# Patient Record
Sex: Female | Born: 1985 | Race: White | Hispanic: No | Marital: Married | Smoking: Never smoker
Health system: Southern US, Community
[De-identification: ages and names within clinical notes are randomized; demographics above are authoritative.]

## PROBLEM LIST (undated history)

## (undated) DIAGNOSIS — R7303 Prediabetes: Secondary | ICD-10-CM

## (undated) DIAGNOSIS — Z8619 Personal history of other infectious and parasitic diseases: Secondary | ICD-10-CM

## (undated) DIAGNOSIS — E282 Polycystic ovarian syndrome: Secondary | ICD-10-CM

## (undated) DIAGNOSIS — I10 Essential (primary) hypertension: Secondary | ICD-10-CM

## (undated) DIAGNOSIS — R519 Headache, unspecified: Secondary | ICD-10-CM

## (undated) DIAGNOSIS — E669 Obesity, unspecified: Secondary | ICD-10-CM

## (undated) DIAGNOSIS — F32A Depression, unspecified: Secondary | ICD-10-CM

## (undated) DIAGNOSIS — M2550 Pain in unspecified joint: Secondary | ICD-10-CM

## (undated) DIAGNOSIS — Z87440 Personal history of urinary (tract) infections: Secondary | ICD-10-CM

## (undated) DIAGNOSIS — G43909 Migraine, unspecified, not intractable, without status migrainosus: Secondary | ICD-10-CM

## (undated) HISTORY — PX: WISDOM TOOTH EXTRACTION: SHX21

## (undated) HISTORY — PX: LAPAROSCOPIC REPAIR AND REMOVAL OF GASTRIC BAND: SHX5919

## (undated) HISTORY — DX: Depression, unspecified: F32.A

## (undated) HISTORY — DX: Prediabetes: R73.03

## (undated) HISTORY — DX: Headache, unspecified: R51.9

## (undated) HISTORY — DX: Personal history of urinary (tract) infections: Z87.440

## (undated) HISTORY — DX: Polycystic ovarian syndrome: E28.2

## (undated) HISTORY — DX: Migraine, unspecified, not intractable, without status migrainosus: G43.909

## (undated) HISTORY — PX: KNEE SURGERY: SHX244

## (undated) HISTORY — DX: Pain in unspecified joint: M25.50

## (undated) HISTORY — DX: Personal history of other infectious and parasitic diseases: Z86.19

## (undated) HISTORY — PX: APPENDECTOMY: SHX54

---

## 2015-12-06 ENCOUNTER — Encounter (HOSPITAL_BASED_OUTPATIENT_CLINIC_OR_DEPARTMENT_OTHER): Payer: Self-pay | Admitting: *Deleted

## 2015-12-06 ENCOUNTER — Emergency Department (HOSPITAL_BASED_OUTPATIENT_CLINIC_OR_DEPARTMENT_OTHER)
Admission: EM | Admit: 2015-12-06 | Discharge: 2015-12-06 | Disposition: A | Discharge status: Home / Self Care | Payer: Medicaid Other | Attending: Emergency Medicine | Admitting: Emergency Medicine

## 2015-12-06 DIAGNOSIS — Z79899 Other long term (current) drug therapy: Secondary | ICD-10-CM | POA: Insufficient documentation

## 2015-12-06 DIAGNOSIS — I159 Secondary hypertension, unspecified: Secondary | ICD-10-CM | POA: Diagnosis not present

## 2015-12-06 DIAGNOSIS — I10 Essential (primary) hypertension: Secondary | ICD-10-CM | POA: Diagnosis present

## 2015-12-06 HISTORY — DX: Essential (primary) hypertension: I10

## 2015-12-06 HISTORY — DX: Obesity, unspecified: E66.9

## 2015-12-06 LAB — URINALYSIS, ROUTINE W REFLEX MICROSCOPIC
BILIRUBIN URINE: NEGATIVE
Glucose, UA: NEGATIVE mg/dL
KETONES UR: NEGATIVE mg/dL
Leukocytes, UA: NEGATIVE
NITRITE: NEGATIVE
Protein, ur: NEGATIVE mg/dL
SPECIFIC GRAVITY, URINE: 1.021 (ref 1.005–1.030)
pH: 7 (ref 5.0–8.0)

## 2015-12-06 LAB — BASIC METABOLIC PANEL
Anion gap: 10 (ref 5–15)
BUN: 12 mg/dL (ref 6–20)
CO2: 26 mmol/L (ref 22–32)
Calcium: 8.5 mg/dL — ABNORMAL LOW (ref 8.9–10.3)
Chloride: 100 mmol/L — ABNORMAL LOW (ref 101–111)
Creatinine, Ser: 0.76 mg/dL (ref 0.44–1.00)
GFR calc Af Amer: >60 mL/min (ref >60)
GFR calc non Af Amer: >60 mL/min (ref >60)
Glucose, Bld: 93 mg/dL (ref 65–99)
Potassium: 3.2 mmol/L — ABNORMAL LOW (ref 3.5–5.1)
Sodium: 136 mmol/L (ref 135–145)

## 2015-12-06 LAB — PREGNANCY, URINE: PREG TEST UR: NEGATIVE

## 2015-12-06 LAB — URINE MICROSCOPIC-ADD ON

## 2015-12-06 MED ORDER — IBUPROFEN 400 MG PO TABS
600.0000 mg | ORAL_TABLET | Freq: Once | ORAL | Status: AC
  Administered 2015-12-06: 600 mg via ORAL
  Filled 2015-12-06: qty 1

## 2015-12-06 MED ORDER — LISINOPRIL 10 MG PO TABS
10.0000 mg | ORAL_TABLET | Freq: Once | ORAL | Status: AC
  Administered 2015-12-06: 10 mg via ORAL
  Filled 2015-12-06: qty 1

## 2015-12-06 MED ORDER — LISINOPRIL 10 MG PO TABS: 10.0000 mg | ORAL_TABLET | Freq: Once | ORAL | Status: DC

## 2015-12-06 MED FILL — LISINOPRIL 10 MG TABLET: 10 | 30 days supply | Qty: 30 | Fill #0

## 2015-12-06 NOTE — ED Provider Notes (Signed)
CSN: [id number]     Arrival date & time 12/06/15  1017 History   First MD Initiated Contact with Patient 12/06/15 1034     Chief Complaint  Patient presents with  . Hypertension     (Consider location/radiation/quality/duration/timing/severity/associated sxs/prior Treatment) HPI Comments: Patient is a 30 year old female with history of hypertension who presents with elevated blood pressure over the past 12 hours. Patient states she has had intermittent lightheadedness, headaches, and left ear tinnitus, which she states she normally gets when her blood pressure is elevated. Patient states she had a few shooting pains in her head and a pulsating headache last night that resolved when she woke up. Patient then had a left-sided headache and she woke up that resolved with Advil. Patient denies any vision changes, however she states that she feels like her vision is not as crisp as normal. Patient took her blood pressure many times throughout the night. Patient states that her blood pressure was as high as 155/110 and as low as 139/107. Patient seemed to be very upset by her elevated blood pressure. Patient has taken her amlodipine and hydrochlorothiazide, however patient has not been able to take her prescribed losartan due to an insurance issue. Patient is currently in transition between primary care providers and is waiting for her medical records to be transferred. She believes she'll be able to establish care soon. Patient denies any chest pain, shortness of breath, abdominal pain, nausea, vomiting, dysuria.  Patient is a 30 y.o. female presenting with hypertension. The history is provided by the patient.  Hypertension Associated symptoms include headaches. Pertinent negatives include no abdominal pain, chest pain, chills, fever, nausea, rash, sore throat or vomiting.    Past Medical History  Diagnosis Date  . Hypertension   . Obesity    Past Surgical History  Procedure Laterality Date  .  Appendectomy    . Knee surgery    . Cesarean section    . Wisdom tooth extraction     No family history on file. Social History  Substance Use Topics  . Smoking status: Never Smoker   . Smokeless tobacco: None  . Alcohol Use: No   OB History    No data available     Review of Systems  Constitutional: Negative for fever and chills.  HENT: Negative for facial swelling and sore throat.   Eyes: Positive for visual disturbance.  Respiratory: Negative for shortness of breath.   Cardiovascular: Negative for chest pain.  Gastrointestinal: Negative for nausea, vomiting and abdominal pain.  Genitourinary: Negative for dysuria.  Musculoskeletal: Negative for back pain.  Skin: Negative for rash and wound.  Neurological: Positive for light-headedness and headaches.  Psychiatric/Behavioral: The patient is not nervous/anxious.       Allergies  Penicillins  Home Medications   Prior to Admission medications   Medication Sig Start Date End Date Taking? Authorizing Provider  amLODipine (NORVASC) 10 MG tablet Take 10 mg by mouth daily.   Yes Historical Provider, MD  hydrochlorothiazide (HYDRODIURIL) 25 MG tablet Take 25 mg by mouth daily.   Yes Historical Provider, MD   BP 117/81 mmHg  Pulse 85  Temp(Src) 98.1 F (36.7 C) (Oral)  Resp 16  Wt 148.009 kg  SpO2 100%  LMP 11/09/2015 (Approximate) Physical Exam  Constitutional: She appears well-developed and well-nourished. No distress.  HENT:  Head: Normocephalic and atraumatic.  Mouth/Throat: Oropharynx is clear and moist. No oropharyngeal exudate.  Eyes: Conjunctivae and EOM are normal. Pupils are equal, round, and reactive to  light. Right eye exhibits no discharge. Left eye exhibits no discharge. No scleral icterus.  Visual acuity 20/15 bilaterally  Neck: Normal range of motion. Neck supple. No thyromegaly present.  Cardiovascular: Normal rate, regular rhythm, normal heart sounds and intact distal pulses.  Exam reveals no gallop  and no friction rub.   No murmur heard. Pulmonary/Chest: Effort normal and breath sounds normal. No stridor. No respiratory distress. She has no wheezes. She has no rales.  Abdominal: Soft. Bowel sounds are normal. She exhibits no distension. There is no tenderness. There is no rebound and no guarding.  Musculoskeletal: She exhibits no edema.  Lymphadenopathy:    She has no cervical adenopathy.  Neurological: She is alert. Coordination normal.  CN 3-12 intact; normal sensation throughout; 5/5 strength in all 4 extremities; equal bilateral grip strength; no ataxia on finger to nose   Skin: Skin is warm and dry. No rash noted. She is not diaphoretic. No pallor.  Psychiatric: She has a normal mood and affect.  Nursing note and vitals reviewed.   ED Course  Procedures (including critical care time) Labs Review Labs Reviewed  URINALYSIS, ROUTINE W REFLEX MICROSCOPIC (NOT AT [facility]) - Abnormal; Notable for the following:    APPearance CLOUDY (*)    Hgb urine dipstick SMALL (*)    All other components within normal limits  URINE MICROSCOPIC-ADD ON - Abnormal; Notable for the following:    Squamous Epithelial / LPF 6-30 (*)    Bacteria, UA MANY (*)    All other components within normal limits  BASIC METABOLIC PANEL - Abnormal; Notable for the following:    Potassium 3.2 (*)    Chloride 100 (*)    Calcium 8.5 (*)    All other components within normal limits  PREGNANCY, URINE    Imaging Review No results found. I have personally reviewed and evaluated these images and lab results as part of my medical decision-making.   EKG Interpretation None      MDM   Patient did have some episodes of left ear tinnitus and mild headache while in ED. Normal neuro exam, no focal deficits, visual acuity 20/15. BMP shows potassium 3.2, chloride 100, calcium 8.5. Urine pregnancy negative. Urine culture sent and would treat if culture returns positive; patient currently denying any urinary symptoms. I  suspect dirty specimen. I gave patient lisinopril 10 mg in ED to reduce BP 117/81. Patient's symptoms completely resolved following blood pressure reduction, oral fluids, and a snack. I will discharge patient home with lisinopril 10 mg in addition to her at home medications. I discussed strict return precautions regarding hypertensive urgency and emergency. I also discussed signs of low blood pressure and how to respond. I advised patient to follow up and establish care with her PCP as soon as possible for further evaluation and treatment of her blood pressure. Patient understands and is in agreement with plan. Patient discharged in satisfactory condition.  Final diagnoses:  Secondary hypertension, unspecified       Emi Holes, PA-C 12/06/15 1722  Lavera Guise, MD 12/06/15 [phone]

## 2015-12-06 NOTE — Discharge Instructions (Signed)
Medications: Lisinopril  Treatment: Take the lisinopril as prescribed for your blood pressure. Taking your other blood pressure medications as prescribed. Drink plenty of water, at least 8 glasses of water per day.  Follow-up: Please follow-up and establish care with your primary care provider as soon as possible for further evaluation and treatment of your high blood pressure. Please return to emergency department if you develop any new or worsening symptoms such as chest pain, shortness of breath, changes in urination, change in vision, or any other concerning symptoms.   Hypertension Hypertension, commonly called high blood pressure, is when the force of blood pumping through your arteries is too strong. Your arteries are the blood vessels that carry blood from your heart throughout your body. A blood pressure reading consists of a higher number over a lower number, such as 110/72. The higher number (systolic) is the pressure inside your arteries when your heart pumps. The lower number (diastolic) is the pressure inside your arteries when your heart relaxes. Ideally you want your blood pressure below 120/80. Hypertension forces your heart to work harder to pump blood. Your arteries may become narrow or stiff. Having untreated or uncontrolled hypertension can cause heart attack, stroke, kidney disease, and other problems. RISK FACTORS Some risk factors for high blood pressure are controllable. Others are not.  Risk factors you cannot control include:   Race. You may be at higher risk if you are African American.  Age. Risk increases with age.  Gender. Men are at higher risk than women before age 31 years. After age 31, women are at higher risk than men. Risk factors you can control include:  Not getting enough exercise or physical activity.  Being overweight.  Getting too much fat, sugar, calories, or salt in your diet.  Drinking too much alcohol. SIGNS AND SYMPTOMS Hypertension does  not usually cause signs or symptoms. Extremely high blood pressure (hypertensive crisis) may cause headache, anxiety, shortness of breath, and nosebleed. DIAGNOSIS To check if you have hypertension, your health care provider will measure your blood pressure while you are seated, with your arm held at the level of your heart. It should be measured at least twice using the same arm. Certain conditions can cause a difference in blood pressure between your right and left arms. A blood pressure reading that is higher than normal on one occasion does not mean that you need treatment. If it is not clear whether you have high blood pressure, you may be asked to return on a different day to have your blood pressure checked again. Or, you may be asked to monitor your blood pressure at home for 1 or more weeks. TREATMENT Treating high blood pressure includes making lifestyle changes and possibly taking medicine. Living a healthy lifestyle can help lower high blood pressure. You may need to change some of your habits. Lifestyle changes may include:  Following the DASH diet. This diet is high in fruits, vegetables, and whole grains. It is low in salt, red meat, and added sugars.  Keep your sodium intake below 2,300 mg per day.  Getting at least 30-45 minutes of aerobic exercise at least 4 times per week.  Losing weight if necessary.  Not smoking.  Limiting alcoholic beverages.  Learning ways to reduce stress. Your health care provider may prescribe medicine if lifestyle changes are not enough to get your blood pressure under control, and if one of the following is true:  You are 5318-31 years of age and your systolic blood pressure  is above 140.  You are 75 years of age or older, and your systolic blood pressure is above 150.  Your diastolic blood pressure is above 90.  You have diabetes, and your systolic blood pressure is over 140 or your diastolic blood pressure is over 90.  You have kidney disease  and your blood pressure is above 140/90.  You have heart disease and your blood pressure is above 140/90. Your personal target blood pressure may vary depending on your medical conditions, your age, and other factors. HOME CARE INSTRUCTIONS  Have your blood pressure rechecked as directed by your health care provider.   Take medicines only as directed by your health care provider. Follow the directions carefully. Blood pressure medicines must be taken as prescribed. The medicine does not work as well when you skip doses. Skipping doses also puts you at risk for problems.  Do not smoke.   Monitor your blood pressure at home as directed by your health care provider. SEEK MEDICAL CARE IF:   You think you are having a reaction to medicines taken.  You have recurrent headaches or feel dizzy.  You have swelling in your ankles.  You have trouble with your vision. SEEK IMMEDIATE MEDICAL CARE IF:  You develop a severe headache or confusion.  You have unusual weakness, numbness, or feel faint.  You have severe chest or abdominal pain.  You vomit repeatedly.  You have trouble breathing. MAKE SURE YOU:   Understand these instructions.  Will watch your condition.  Will get help right away if you are not doing well or get worse.   This information is not intended to replace advice given to you by your health care provider. Make sure you discuss any questions you have with your health care provider.   Document Released: 06/04/2005 Document Revised: 10/19/2014 Document Reviewed: 03/27/2013 Elsevier Interactive Patient Education [employer]

## 2015-12-06 NOTE — ED Notes (Signed)
Pt here due to HA and ringing in ears at home which she attributes to hypertension not controlled with BP meds.  Pt is on HZTC 25mg  and amlodipine 10mg  at home daily.  Pt is supposed to be on losartan but due to insurance issue she has been without this for a few months.  BP 159/114 at beginning of triage, pt tearful. No pain at this time.

## 2015-12-07 LAB — URINE CULTURE
Culture: 1000 — AB
Special Requests: NORMAL

## 2020-03-20 ENCOUNTER — Emergency Department: Payer: Medicaid Other

## 2020-03-20 ENCOUNTER — Emergency Department
Admission: EM | Admit: 2020-03-20 | Discharge: 2020-03-20 | Disposition: A | Discharge status: Home / Self Care | Payer: Medicaid Other | Attending: Emergency Medicine | Admitting: Emergency Medicine

## 2020-03-20 ENCOUNTER — Encounter: Payer: Self-pay | Admitting: Radiology

## 2020-03-20 ENCOUNTER — Other Ambulatory Visit: Payer: Self-pay

## 2020-03-20 DIAGNOSIS — R55 Syncope and collapse: Secondary | ICD-10-CM | POA: Diagnosis not present

## 2020-03-20 DIAGNOSIS — Z79899 Other long term (current) drug therapy: Secondary | ICD-10-CM | POA: Diagnosis not present

## 2020-03-20 DIAGNOSIS — I10 Essential (primary) hypertension: Secondary | ICD-10-CM | POA: Insufficient documentation

## 2020-03-20 DIAGNOSIS — W1809XA Striking against other object with subsequent fall, initial encounter: Secondary | ICD-10-CM | POA: Insufficient documentation

## 2020-03-20 DIAGNOSIS — M25572 Pain in left ankle and joints of left foot: Secondary | ICD-10-CM | POA: Diagnosis present

## 2020-03-20 DIAGNOSIS — M25562 Pain in left knee: Secondary | ICD-10-CM | POA: Insufficient documentation

## 2020-03-20 LAB — BASIC METABOLIC PANEL
Anion gap: 10 (ref 5–15)
BUN: 16 mg/dL (ref 6–20)
CO2: 23 mmol/L (ref 22–32)
Calcium: 8.6 mg/dL — ABNORMAL LOW (ref 8.9–10.3)
Chloride: 104 mmol/L (ref 98–111)
Creatinine, Ser: 0.81 mg/dL (ref 0.44–1.00)
GFR calc Af Amer: >60 mL/min (ref >60)
GFR calc non Af Amer: >60 mL/min (ref >60)
Glucose, Bld: 147 mg/dL — ABNORMAL HIGH (ref 70–99)
Potassium: 3.2 mmol/L — ABNORMAL LOW (ref 3.5–5.1)
Sodium: 137 mmol/L (ref 135–145)

## 2020-03-20 LAB — GLUCOSE, CAPILLARY: Glucose-Capillary: 147 mg/dL — ABNORMAL HIGH (ref 70–99)

## 2020-03-20 LAB — CBC
HCT: 35.9 % — ABNORMAL LOW (ref 36.0–46.0)
Hemoglobin: 11.8 g/dL — ABNORMAL LOW (ref 12.0–15.0)
MCH: 23.7 pg — ABNORMAL LOW (ref 26.0–34.0)
MCHC: 32.9 g/dL (ref 30.0–36.0)
MCV: 72.1 fL — ABNORMAL LOW (ref 80.0–100.0)
Platelets: 331 10*3/uL (ref 150–400)
RBC: 4.98 MIL/uL (ref 3.87–5.11)
RDW: 16.1 % — ABNORMAL HIGH (ref 11.5–15.5)
WBC: 8.3 10*3/uL (ref 4.0–10.5)
nRBC: 0 % (ref 0.0–0.2)

## 2020-03-20 MED ORDER — ACETAMINOPHEN 500 MG PO TABS
1000.0000 mg | ORAL_TABLET | Freq: Once | ORAL | Status: AC
  Administered 2020-03-20: 1000 mg via ORAL

## 2020-03-20 MED ORDER — ACETAMINOPHEN 325 MG PO TABS: 650.0000 mg | ORAL_TABLET | Freq: Once | ORAL | Status: AC

## 2020-03-20 MED ORDER — ACETAMINOPHEN 325 MG PO TABS
ORAL_TABLET | ORAL | Status: AC
  Administered 2020-03-20: 650 mg via ORAL
  Filled 2020-03-20: qty 2

## 2020-03-20 NOTE — ED Provider Notes (Signed)
[facility] Emergency Department Provider Note   ____________________________________________   First MD Initiated Contact with Patient 03/20/20 2219     (approximate)  I have reviewed the triage vital signs and the nursing notes.   HISTORY  Chief Complaint Ankle Pain and Loss of Consciousness   HPI Tabitha Golden is a 34 y.o. female patient reports she tripped and twisted or hit her left ankle and heard a pop in her left ankle.  Here in the emergency room when she was moving she had pain and then got very lightheaded passed out briefly and was sleepy for a long time after that.  She denies taking any medications.  She fell asleep multiple times in triage but was arousable.  We got a head CT just to make sure eating was okay and it was.  Patient complains of pain in her ankle and knee with movement.         Past Medical History:  Diagnosis Date  . Hypertension   . Obesity     There are no problems to display for this patient.   Past Surgical History:  Procedure Laterality Date  . APPENDECTOMY    . CESAREAN SECTION    . KNEE SURGERY    . WISDOM TOOTH EXTRACTION      Prior to Admission medications   Medication Sig Start Date End Date Taking? Authorizing Provider  amLODipine (NORVASC) 10 MG tablet Take 10 mg by mouth daily.    [provider]  hydrochlorothiazide (HYDRODIURIL) 25 MG tablet Take 25 mg by mouth daily.    [provider]  lisinopril (PRINIVIL,ZESTRIL) 10 MG tablet Take 1 tablet (10 mg total) by mouth once. 12/06/15   Emi Holes, PA-C    Allergies Penicillins  No family history on file.  Social History Social History   Tobacco Use  . Smoking status: Never Smoker  Substance Use Topics  . Alcohol use: No  . Drug use: Not on file    Review of Systems  Constitutional: No fever/chills Eyes: No visual changes. ENT: No sore throat. Cardiovascular: Denies chest pain. Respiratory: Denies shortness of  breath. Gastrointestinal: No abdominal pain.  No nausea, no vomiting.  No diarrhea.  No constipation. Genitourinary: Negative for dysuria. Musculoskeletal: Negative for back pain. Skin: Negative for rash. Neurological: Negative for headaches, focal weakness   ____________________________________________   PHYSICAL EXAM:  VITAL SIGNS: ED Triage Vitals  Enc Vitals Group     BP 03/20/20 1621 140/81     Pulse Rate 03/20/20 1621 88     Resp 03/20/20 1621 18     Temp 03/20/20 1627 99.5 F (37.5 C)     Temp Source 03/20/20 1627 Oral     SpO2 03/20/20 1621 99 %     Weight 03/20/20 1619 (!) 350 lb (158.8 kg)     Height 03/20/20 1619 5\' 7"  (1.702 m)     Head Circumference --      Peak Flow --      Pain Score 03/20/20 1618 7     Pain Loc --      Pain Edu? --      Excl. in GC? --    Constitutional: Patient is now alert and oriented. Well appearing and in no acute distress. Eyes: Conjunctivae are normal. PER. EOMI. Head: Atraumatic. Nose: No congestion/rhinnorhea. Mouth/Throat: Mucous membranes are moist.  Oropharynx non-erythematous. Neck: No stridor Cardiovascular: Normal rate, regular rhythm. Grossly normal heart sounds.  Good peripheral circulation. Respiratory: Normal respiratory  effort.  No retractions. Lungs CTAB. Gastrointestinal: Soft and nontender. No distention. No abdominal bruits. No CVA tenderness. Musculoskeletal: No lower extremity tenderness nor edema.  Ankle is tender laterally over the tibia to palpation.  Somewhat difficult to check stability because of pain but it appears to be stable knee on the left side is tender posteriorly there is no noticeable effusion there is no anterior lateral and medial tenderness only posterior tenderness when I attempt to check for stability it there is some pain posteriorly the knee does appear to be stable. Neurologic:  Normal speech and language. No gross focal neurologic deficits are appreciated.  Skin:  Skin is warm, dry and  intact.    ____________________________________________   LABS (all labs ordered are listed, but only abnormal results are displayed)  Labs Reviewed  BASIC METABOLIC PANEL - Abnormal; Notable for the following components:      Result Value   Potassium 3.2 (*)    Glucose, Bld 147 (*)    Calcium 8.6 (*)    All other components within normal limits  CBC - Abnormal; Notable for the following components:   Hemoglobin 11.8 (*)    HCT 35.9 (*)    MCV 72.1 (*)    MCH 23.7 (*)    RDW 16.1 (*)    All other components within normal limits  GLUCOSE, CAPILLARY - Abnormal; Notable for the following components:   Glucose-Capillary 147 (*)    All other components within normal limits  URINALYSIS, COMPLETE (UACMP) WITH MICROSCOPIC  CBG MONITORING, ED  POC URINE PREG, ED   ____________________________________________  EKG Read interpreted by me shows normal sinus rhythm rate of 83 normal axis there are Q's inferiorly but no other changes.  There are no old EKGs  ____________________________________________  RADIOLOGY Jill Poling, personally viewed and evaluated these images (plain radiographs) as part of my medical decision making, as well as reviewing the written report by the radiologist.  ED MD interpretation ankle x-ray read by radiology reviewed by me shows only the sclerotic area in the left tibia. CT head read by radiology also reviewed by me does not show any problems. Official radiology report(s): DG Ankle Complete Left  Result Date: 03/20/2020 CLINICAL DATA:  Status post trauma. EXAM: LEFT ANKLE COMPLETE - 3+ VIEW COMPARISON:  None. FINDINGS: There is no evidence of an acute fracture, dislocation, or joint effusion. A 3.3 cm x 1.8 cm x 1.5 cm well-defined sclerotic area, containing innumerable subcentimeter lytic foci, is seen within the medullary region of the distal left tibia. There is mild diffuse soft tissue swelling. IMPRESSION: 1. No acute fracture or dislocation.  2. Benign-appearing sclerotic within the distal left tibia. Further evaluation with nonemergent MRI recommended. Electronically Signed   By: Aram Candela M.D.   On: 03/20/2020 18:28   CT HEAD WO CONTRAST  Result Date: 03/20/2020 CLINICAL DATA:  Fall EXAM: CT HEAD WITHOUT CONTRAST TECHNIQUE: Contiguous axial images were obtained from the base of the skull through the vertex without intravenous contrast. COMPARISON:  MR brain dated 08/14/2019 FINDINGS: Brain: No evidence of acute infarction, hemorrhage, hydrocephalus, extra-axial collection or mass lesion/mass effect. Vascular: No hyperdense vessel or unexpected calcification. Skull: Normal. Negative for fracture or focal lesion. Sinuses/Orbits: The visualized paranasal sinuses are essentially clear. The mastoid air cells are unopacified. Other: None. IMPRESSION: Normal head CT. Electronically Signed   By: Charline Bills M.D.   On: 03/20/2020 16:48    ____________________________________________   PROCEDURES  Procedure(s) performed (including Critical  Care):  Procedures   ____________________________________________   INITIAL IMPRESSION / ASSESSMENT AND PLAN / ED COURSE  Patient is now awake alert and oriented.  She looks well.  CT was negative.  EKG looks okay.  Is possible and even likely that she had a vasovagal episode from pain while she was moving.  We will have her follow-up with her primary care for this and with orthopedics for her knee and ankle pain.              ____________________________________________   FINAL CLINICAL IMPRESSION(S) / ED DIAGNOSES  Final diagnoses:  Acute left ankle pain  Acute pain of left knee  Syncope, unspecified syncope type     ED Discharge Orders    None      *Please note:  Chong January was evaluated in Emergency Department on 03/20/2020 for the symptoms described in the history of present illness. She was evaluated in the context of the global COVID-19 pandemic, which  necessitated consideration that the patient might be at risk for infection with the SARS-CoV-2 virus that causes COVID-19. Institutional protocols and algorithms that pertain to the evaluation of patients at risk for COVID-19 are in a state of rapid change based on information released by regulatory bodies including the CDC and federal and state organizations. These policies and algorithms were followed during the patient's care in the ED.  Some ED evaluations and interventions may be delayed as a result of limited staffing during and the pandemic.*   Note:  This document was prepared using Dragon voice recognition software and may include unintentional dictation errors.    Arnaldo Natal, MD 03/21/20 Aretha Parrot

## 2020-03-20 NOTE — ED Notes (Signed)
Fell onto her rearend from a curb. Has complaints of left ankle pain. Has history of HTN. BP elevated slightly.

## 2020-03-20 NOTE — ED Notes (Signed)
Pt to xray

## 2020-03-20 NOTE — ED Triage Notes (Addendum)
Pt comes POV after tripping and hitting left ankle. Pt also states that afterwards she felt lightheaded and sleepy. Can't stay awake since ankle popped. Pt denies taking any medications. Pt falling asleep multiple times in triage but arousable to speech. PERRLA.

## 2020-03-20 NOTE — Discharge Instructions (Addendum)
Please use Advil or Tylenol for pain.  You can put ice on your knee and ankle for 20 minutes every hour or 2.  Wear the ankle stirrup splint when you are walking.  Use the walker to limit the weightbearing on your knee and ankle.  Please call Dr. Rondel Baton office in the morning and set up a follow-up appointment in about a week.  Please follow-up with your primary care doctor for the episode of passing out here in the emergency room.

## 2021-08-11 IMAGING — CT CT HEAD W/O CM
3 series · 15 of 47 positions shown, 18 images · non-contrast
Comparison: MR brain dated 08/14/2019

CLINICAL DATA: Fall

EXAM:
CT HEAD WITHOUT CONTRAST
TECHNIQUE: Contiguous axial images were obtained from the base of the skull
through the vertex without intravenous contrast.

[Series 3: coronal soft tissue · coronal · 0.30mm/px · 3 of 77 slices shown]
[im 28/77  brain]
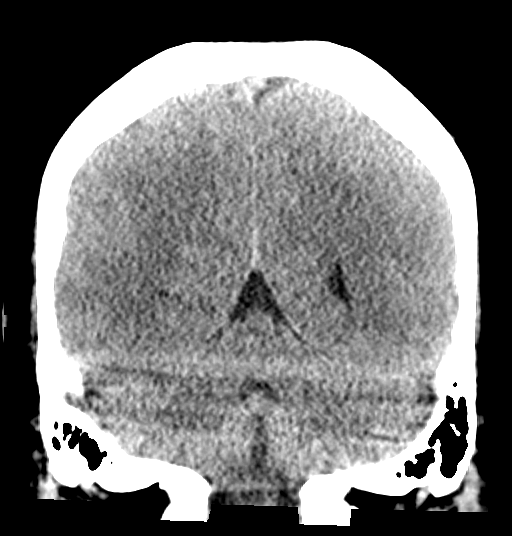
[im 35/77  brain]
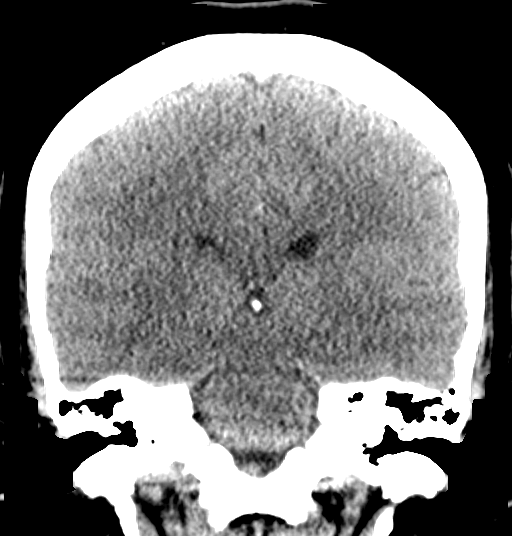
[im 42/77  brain]
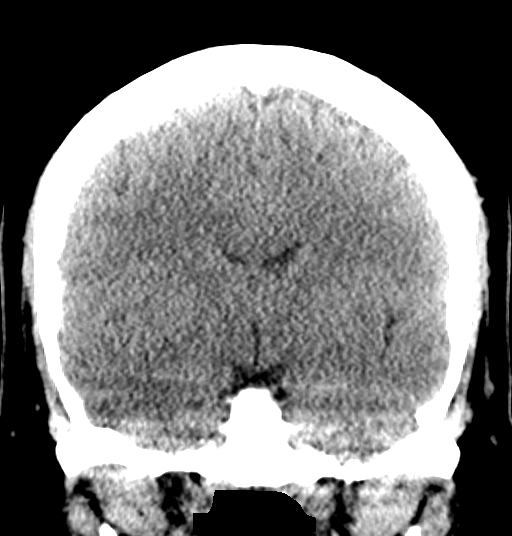

[Series 4: sagittal soft tissue · sagittal · 0.31mm/px · 3 of 51 slices shown]
[im 17/51  brain]
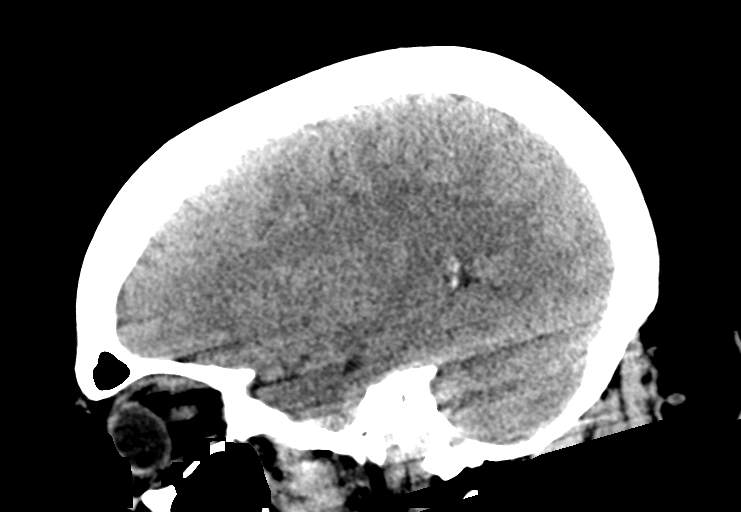
[im 26/51  brain]
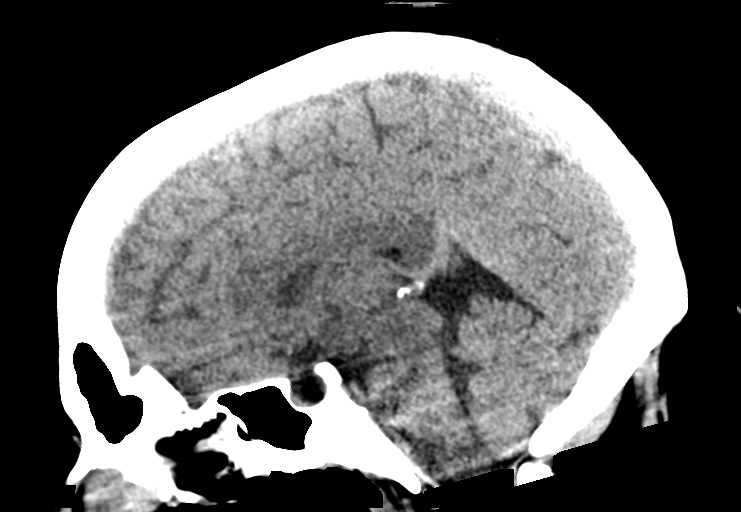
[im 34/51  brain]
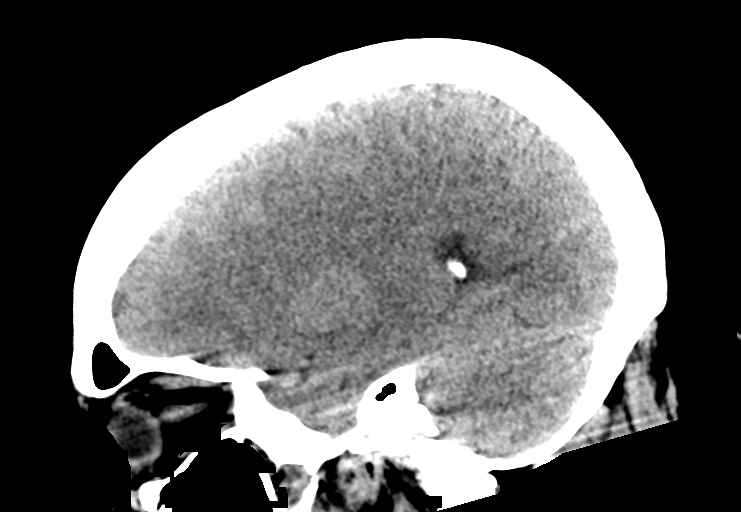

[Series 5: head wo · axial · 0.48mm/px · z∈[-139,-9]mm · 9 of 32 slices shown, 12 images]
[im 3/32  brain]
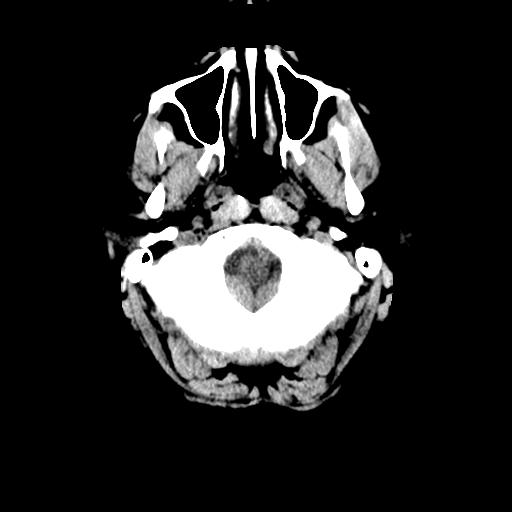
[im 3/32  bone]
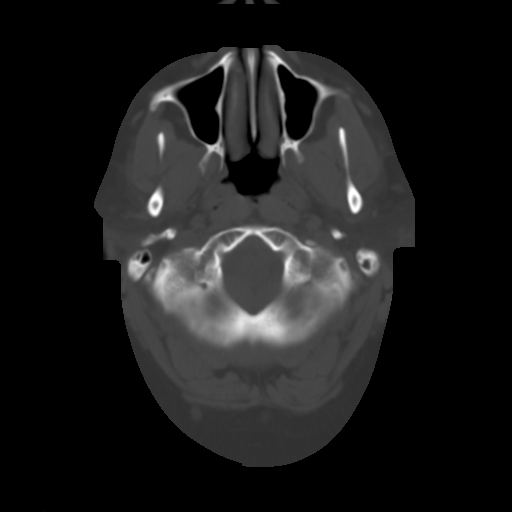
[im 6/32  brain]
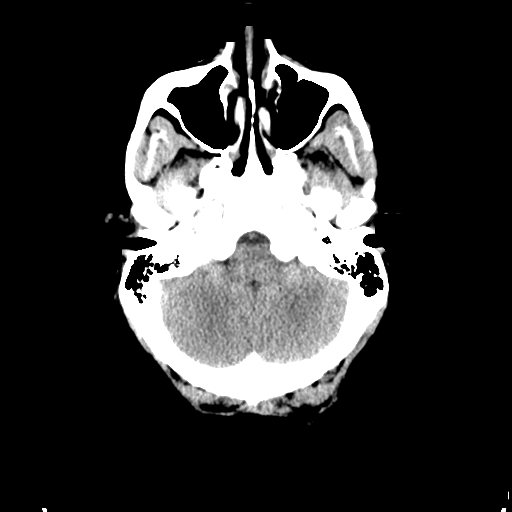
[im 9/32  brain]
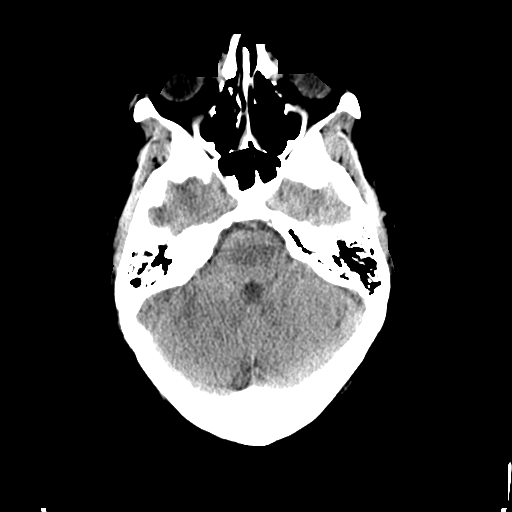
[im 12/32  brain]
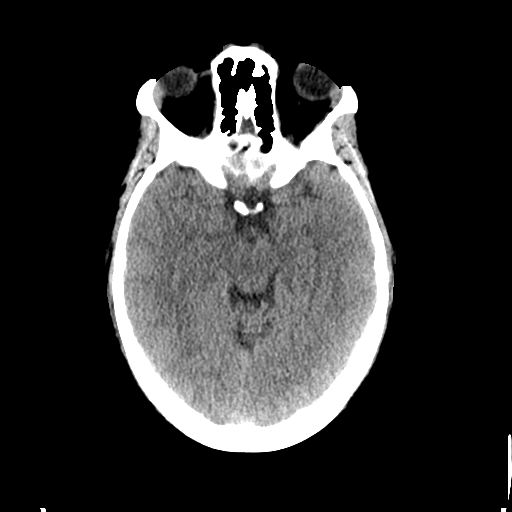
[im 17/32  brain]
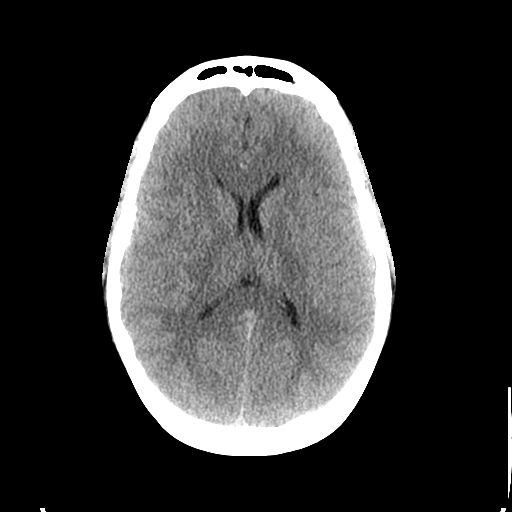
[im 17/32  bone]
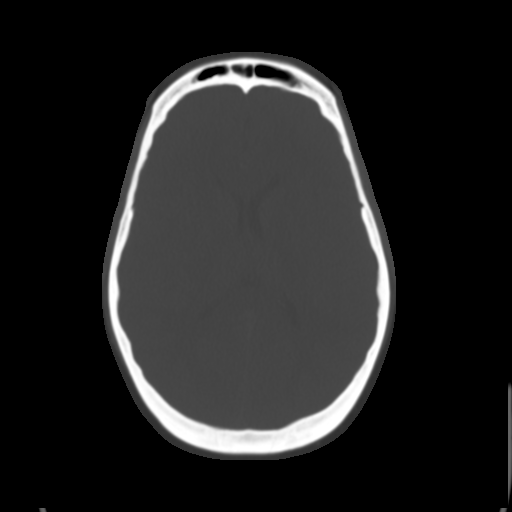
[im 20/32  brain]
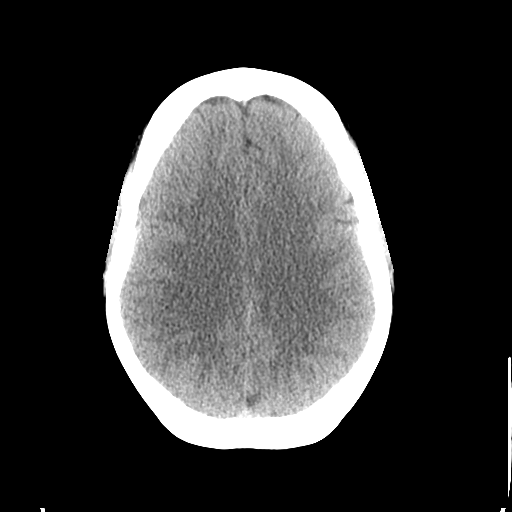
[im 23/32  brain]
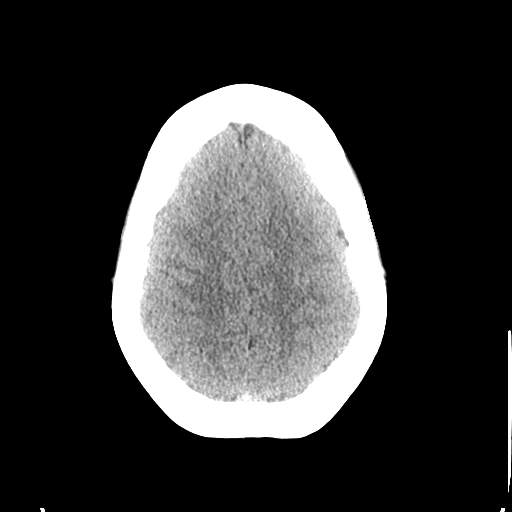
[im 26/32  brain]
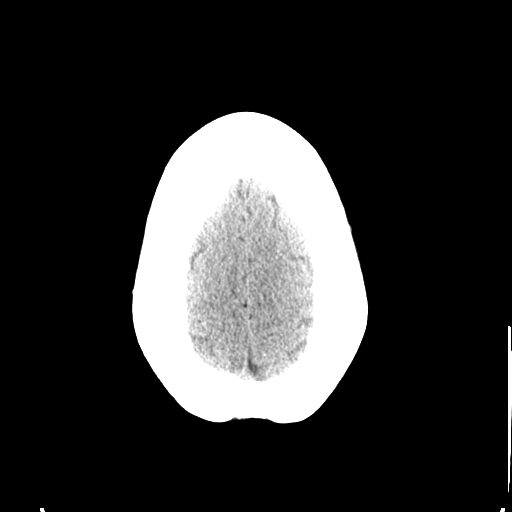
[im 29/32  brain]
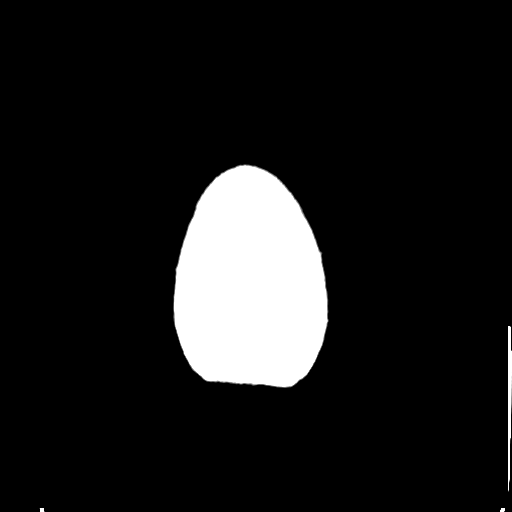
[im 29/32  bone]
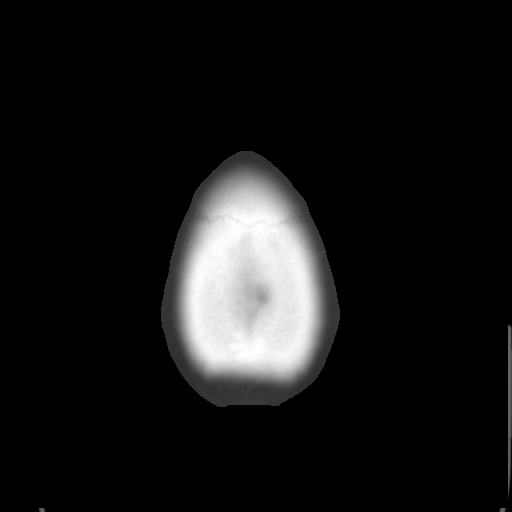

[15 of 47 positions shown; findings below may reference images not displayed]

FINDINGS: Brain: No evidence of acute infarction, hemorrhage, hydrocephalus,
extra-axial collection or mass lesion/mass effect.

Vascular: No hyperdense vessel or unexpected calcification.

Skull: Normal. Negative for fracture or focal lesion.

Sinuses/Orbits: The visualized paranasal sinuses are essentially
clear. The mastoid air cells are unopacified.

Other: None.
IMPRESSION: Normal head CT.

## 2021-09-06 ENCOUNTER — Telehealth: Payer: Self-pay

## 2021-09-06 NOTE — Telephone Encounter (Signed)
[facility] referring for [facility] women[facility] exam. Paper records in media. Scheduled with with CNM or PA within 4-6 wks. Called and left voicemail for patient to call back to be scheduled.

## 2021-09-07 NOTE — Telephone Encounter (Signed)
Called and left voicemail for patient to call back to be scheduled. 

## 2021-09-12 NOTE — Telephone Encounter (Signed)
Patient is scheduled for 09/26/21 with [facility]

## 2021-09-26 ENCOUNTER — Encounter: Payer: Medicaid Other | Admitting: Obstetrics

## 2021-11-01 ENCOUNTER — Encounter: Payer: Medicaid Other | Admitting: Obstetrics

## 2021-11-20 ENCOUNTER — Encounter: Payer: Medicaid Other | Admitting: Obstetrics

## 2021-11-29 ENCOUNTER — Encounter: Payer: Self-pay | Admitting: Obstetrics

## 2021-11-29 ENCOUNTER — Other Ambulatory Visit (HOSPITAL_COMMUNITY)
Admission: RE | Admit: 2021-11-29 | Discharge: 2021-11-29 | Disposition: A | Discharge status: Home / Self Care | Payer: Medicaid Other | Source: Ambulatory Visit | Attending: Obstetrics | Admitting: Obstetrics

## 2021-11-29 ENCOUNTER — Ambulatory Visit (INDEPENDENT_AMBULATORY_CARE_PROVIDER_SITE_OTHER): Payer: 59 | Admitting: Obstetrics

## 2021-11-29 VITALS — BP 122/84 | Ht 67.0 in | Wt 332.0 lb

## 2021-11-29 DIAGNOSIS — Z124 Encounter for screening for malignant neoplasm of cervix: Secondary | ICD-10-CM | POA: Diagnosis not present

## 2021-11-29 DIAGNOSIS — Z01419 Encounter for gynecological examination (general) (routine) without abnormal findings: Secondary | ICD-10-CM | POA: Diagnosis not present

## 2021-11-29 NOTE — Progress Notes (Signed)
Gynecology Annual Exam   PCP: Pcp, No  Chief Complaint:  Chief Complaint  Patient presents with   Annual Exam    History of Present Illness: Patient is a 36 y.o. [id number] presents for annual exam. The patient has no complaints today.   LMP: Patient's last menstrual period was 11/13/2021 (approximate). Average Interval: regular, 22 days Duration of flow: 5 days Heavy Menses: no Clots: no Intermenstrual Bleeding: no Postcoital Bleeding: no Dysmenorrhea: no  The patient is sexually active. She currently uses tubal ligation for contraception. She denies dyspareunia.  The patient does perform self breast exams.  There is no notable family history of breast or ovarian cancer in her family.  The patient wears seatbelts: yes.   The patient has regular exercise: no.    The patient denies current symptoms of depression.    Review of Systems: Review of Systems  Constitutional: Negative.   HENT: Negative.    Eyes: Negative.   Respiratory: Negative.    Cardiovascular: Negative.   Gastrointestinal: Negative.   Genitourinary: Negative.   Musculoskeletal: Negative.   Skin: Negative.   Neurological: Negative.   Endo/Heme/Allergies: Negative.   Psychiatric/Behavioral: Negative.      Past Medical History:  There are no problems to display for this patient.   Past Surgical History:  Past Surgical History:  Procedure Laterality Date   APPENDECTOMY     CESAREAN SECTION     KNEE SURGERY     WISDOM TOOTH EXTRACTION      Gynecologic History:  Patient's last menstrual period was 11/13/2021 (approximate). Contraception: tubal ligation Last Pap: Results were:  results unavailable. She reports it has been three years since last pap    Obstetric History: [id number]  Family History:  History reviewed. No pertinent family history.  Social History:  Social History   Socioeconomic History   Marital status: Married    Spouse name: Not on file   Number of children: 2   Years of  education: Not on file   Highest education level: Not on file  Occupational History   Not on file  Tobacco Use   Smoking status: Never   Smokeless tobacco: Not on file  Substance and Sexual Activity   Alcohol use: No    Comment: occasional   Drug use: Never   Sexual activity: Yes    Birth control/protection: Surgical  Other Topics Concern   Not on file  Social History Narrative   Not on file   Social Determinants of Health   Financial Resource Strain: Not on file  Food Insecurity: Not on file  Transportation Needs: Not on file  Physical Activity: Not on file  Stress: Not on file  Social Connections: Not on file  Intimate Partner Violence: Not on file    Allergies:  Allergies  Allergen Reactions   Penicillins Hives and Rash   Other Other (See Comments)    Unknown blood pressure medication per pt, experienced dry mouth Unknown blood pressure medication per pt, experienced dry mouth. Procardia    Lisinopril     Other reaction(s): Other (See Comments) Cough Cough    Penciclovir Rash    Medications: Prior to Admission medications   Medication Sig Start Date End Date Taking? Authorizing Provider  amLODipine (NORVASC) 10 MG tablet Take 10 mg by mouth daily.   Yes [provider]  lisinopril (PRINIVIL,ZESTRIL) 10 MG tablet Take 1 tablet (10 mg total) by mouth once. 12/06/15  Yes Law, Waylan Boga, PA-C  losartan-hydrochlorothiazide (HYZAAR) 50-12.5  MG tablet Take 1 tablet by mouth daily. 08/30/21  Yes [provider]  montelukast (SINGULAIR) 10 MG tablet as needed.   Yes [provider]  OZEMPIC, 0.25 OR 0.5 MG/DOSE, 2 MG/3ML SOPN SMARTSIG:0.2 Milliliter(s) SUB-Q Once a Week 10/24/21  Yes [provider]    Physical Exam Vitals: Blood pressure 122/84, height 5\' 7"  (1.702 m), weight (!) 332 lb (150.6 kg), last menstrual period 11/13/2021.  General: NAD HEENT: normocephalic, anicteric Thyroid: no enlargement, no palpable  nodules Pulmonary: No increased work of breathing, CTAB Cardiovascular: RRR, distal pulses 2+ Breast: Breast symmetrical, no tenderness, no palpable nodules or masses, no skin or nipple retraction present, no nipple discharge.  No axillary or supraclavicular lymphadenopathy. Abdomen: NABS, soft, non-tender, non-distended.  Umbilicus without lesions.  No hepatomegaly, splenomegaly or masses palpable. No evidence of hernia  Genitourinary:  External: Normal external female genitalia.  Normal urethral meatus, normal Bartholin's and Skene's glands.    Vagina: Normal vaginal mucosa, no evidence of prolapse.    Cervix: Grossly normal in appearance, no bleeding  Uterus: Non-enlarged, mobile, normal contour.  No CMT  Adnexa: ovaries non-enlarged, no adnexal masses  Rectal: deferred  Lymphatic: no evidence of inguinal lymphadenopathy Extremities: no edema, erythema, or tenderness Neurologic: Grossly intact Psychiatric: mood appropriate, affect full  Female chaperone present for pelvic and breast  portions of the physical exam    Assessment: 36 y.o. 30 routine annual exam  Plan: Problem List Items Addressed This Visit   None Visit Diagnoses     Women's annual routine gynecological examination    -  Primary   Cervical cancer screening           2) STI screening  wasoffered and declined  2)  ASCCP guidelines and rational discussed.  Patient opts for every 3 years screening interval  3) Contraception - the patient is currently using  tubal ligation.  She is happy with her current form of contraception and plans to continue  4) Routine healthcare maintenance including cholesterol, diabetes screening discussed managed by PCP  5) No follow-ups on file.  We discussed her need for a more active regime and adlso gently addressed weight loss. She is working on this via [id number]. Has had a lap band in the past. Being followed by PCP for this.   [place], CNM  11/29/2021 9:19 AM    [facility], [facility] 11/29/2021, 9:19 AM

## 2021-12-04 LAB — CYTOLOGY - PAP
Comment: NEGATIVE
Diagnosis: NEGATIVE
High risk HPV: NEGATIVE

## 2022-09-05 ENCOUNTER — Encounter (INDEPENDENT_AMBULATORY_CARE_PROVIDER_SITE_OTHER): Payer: 59 | Admitting: Family Medicine

## 2022-09-10 DIAGNOSIS — Z0289 Encounter for other administrative examinations: Secondary | ICD-10-CM

## 2022-09-12 ENCOUNTER — Encounter (INDEPENDENT_AMBULATORY_CARE_PROVIDER_SITE_OTHER): Payer: 59 | Admitting: Family Medicine

## 2022-10-03 ENCOUNTER — Encounter (INDEPENDENT_AMBULATORY_CARE_PROVIDER_SITE_OTHER): Payer: Self-pay | Admitting: Family Medicine

## 2022-10-03 ENCOUNTER — Ambulatory Visit (INDEPENDENT_AMBULATORY_CARE_PROVIDER_SITE_OTHER): Payer: No Typology Code available for payment source | Admitting: Family Medicine

## 2022-10-03 VITALS — BP 162/92 | HR 81 | Temp 97.5°F | Ht 68.0 in | Wt 366.0 lb

## 2022-10-03 DIAGNOSIS — R0602 Shortness of breath: Secondary | ICD-10-CM

## 2022-10-03 DIAGNOSIS — E785 Hyperlipidemia, unspecified: Secondary | ICD-10-CM

## 2022-10-03 DIAGNOSIS — Z9189 Other specified personal risk factors, not elsewhere classified: Secondary | ICD-10-CM

## 2022-10-03 DIAGNOSIS — R7303 Prediabetes: Secondary | ICD-10-CM

## 2022-10-03 DIAGNOSIS — I10 Essential (primary) hypertension: Secondary | ICD-10-CM | POA: Diagnosis not present

## 2022-10-03 DIAGNOSIS — F339 Major depressive disorder, recurrent, unspecified: Secondary | ICD-10-CM

## 2022-10-03 DIAGNOSIS — R5383 Other fatigue: Secondary | ICD-10-CM

## 2022-10-03 DIAGNOSIS — Z1331 Encounter for screening for depression: Secondary | ICD-10-CM

## 2022-10-03 DIAGNOSIS — E669 Obesity, unspecified: Secondary | ICD-10-CM

## 2022-10-03 DIAGNOSIS — D508 Other iron deficiency anemias: Secondary | ICD-10-CM | POA: Diagnosis not present

## 2022-10-03 DIAGNOSIS — Z6841 Body Mass Index (BMI) 40.0 and over, adult: Secondary | ICD-10-CM

## 2022-10-03 MED ORDER — FUSION PLUS PO CAPS
1.0000 | ORAL_CAPSULE | Freq: Every day | ORAL | 0 refills | Status: DC
Start: 2022-10-03 — End: 2023-04-16

## 2022-10-03 MED ORDER — SERTRALINE HCL 50 MG PO TABS
50.0000 mg | ORAL_TABLET | Freq: Every day | ORAL | 0 refills | Status: DC
Start: 2022-10-03 — End: 2022-10-30

## 2022-10-03 NOTE — Progress Notes (Signed)
Chief Complaint:   OBESITY Tabitha Golden (MR# [id number]) is a 37 y.o. female who presents for evaluation and treatment of obesity and related comorbidities. Current BMI is Body mass index is 55.65 kg/m. Tabitha Golden has been struggling with her weight for many years and has been unsuccessful in either losing weight, maintaining weight loss, or reaching her healthy weight goal.  Patient came to information session. 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  In office she works 9-6 two days a week. Lives at home with her husband Tabitha Golden), son Tabitha Golden, age 55), and daughter Tabitha Golden, age 48).  Patient desired weight is 200lb. History of lap band placement and removal.  She has tried Ozempic previously twice but had significant side effects.  Eats fast food or takeout 7-10xs a week.  Breakfast when she goes into the office at [facility] [employer].  Lunch is take out at [employer].   2 mini bagels in the am with chive cream cheese or strawberry cream cheese (satisfied).  No snacks between BF and lunch.  Drinks 2 20oz tumblers with 2tbsp of creamer and truvia.  Lunch is leftovers like chicken salad on a croissant with an individual bag of chips (feels satisfied).  Snack between lunch and dinner is string cheese with pepperoni.  Dinner is chicken (3 tenderloins), mashed potatoes (1.5cups) and green beans (1.5 cups) (feels full).  Occasionally eats after supper of a few cookies.   Tabitha Golden is currently in the action stage of change and ready to dedicate time achieving and maintaining a healthier weight. Tabitha Golden is interested in becoming our patient and working on intensive lifestyle modifications including (but not limited to) diet and exercise for weight loss.  Tabitha Golden's habits were reviewed today and are as follows: Her family eats meals together, she thinks her family will eat healthier with her, her desired weight loss is 166 lbs, she has been heavy most of her life, she started gaining  weight at age 89, her heaviest weight ever was 360 pounds, she has significant food cravings issues, she snacks frequently in the evenings, she skips meals frequently, she is frequently drinking liquids with calories, she frequently makes poor food choices, she has problems with excessive hunger, she frequently eats larger portions than normal, and she struggles with emotional eating.  Depression Screen Tabitha Golden's Food and Mood (modified PHQ-9) score was 20.  Subjective:   1. Other fatigue Tabitha Golden admits to daytime somnolence and admits to waking up still tired. Patient has a history of symptoms of daytime fatigue, morning fatigue, and morning headache. Tabitha Golden generally gets 6 hours of sleep per night, and states that she has nightime awakenings. Snoring is present. Apneic episodes are not present. Epworth Sleepiness Score is 6.  EKG NSR at 69 bpm.  2. SOBOE (shortness of breath on exertion) Tabitha Golden notes increasing shortness of breath with exercising and seems to be worsening over time with weight gain. She notes getting out of breath sooner with activity than she used to. This has not gotten worse recently. Tabitha Golden denies shortness of breath at rest or orthopnea.  3. Other iron deficiency anemia Patient is not on iron, prenatal vitamin or a multivitamin.  4. Essential hypertension Patient blood pressure very elevated today.  Patient has a history of preeclampsia.  Patient is taking losartan/HCTZ.  Blood pressure elevated at last appointment in October 2023.  5. Prediabetes Patient diagnosed many years ago.  Previously on metformin for PCOS.  6. Dyslipidemia  Elevated LDL, decreased HDL, increased triglycerides.  Patient is not taking any medications.  7. Depression, recurrent Patient was previously on sertraline.  Patient denies suicidal or homicidal ideations.  8. At risk for heart disease Patient has a history of preeclampsia with delivery at 27 weeks and then 36 weeks.  Patient reports that  she previously had echo around 2011 after her first pregnancy.  Assessment/Plan:   1. Other fatigue Tabitha Golden does feel that her weight is causing her energy to be lower than it should be. Fatigue may be related to obesity, depression or many other causes. Labs will be ordered, and in the meanwhile, Tabitha Golden will focus on self care including making healthy food choices, increasing physical activity and focusing on stress reduction.  Update labs, check IC and EKG today.  - EKG 12-Lead - VITAMIN D 25 Hydroxy (Vit-D Deficiency, Fractures) - TSH - T4, free - T3  2. SOBOE (shortness of breath on exertion) Tabitha Golden does feel that she gets out of breath more easily that she used to when she exercises. Tabitha Golden's shortness of breath appears to be obesity related and exercise induced. She has agreed to work on weight loss and gradually increase exercise to treat her exercise induced shortness of breath. Will continue to monitor closely.  3. Other iron deficiency anemia Check labs today.  - Vitamin B12 - CBC with Differential/Platelet - Folate  Start- Iron-FA-B Cmp-C-Biot-Probiotic (FUSION PLUS) CAPS; Take 1 capsule by mouth daily.  Dispense: 90 capsule; Refill: 0 - Anemia panel  4. Essential hypertension Follow-up blood pressure next appointment.  If blood pressure still elevated will increase medications.  Echo.  Check labs today.  - Comprehensive metabolic panel - ECHOCARDIOGRAM COMPLETE; Future  5. Prediabetes Check labs today.  - Hemoglobin A1c - Insulin, random  6. Dyslipidemia Check labs today.  - Lipid Panel With LDL/HDL Ratio  7. Depression, recurrent Start- sertraline (ZOLOFT) 50 MG tablet; Take 1 tablet (50 mg total) by mouth daily.  Dispense: 30 tablet; Refill: 0  8. At risk for heart disease Discussed significant increase in elevation of risk of heart disease with preeclampsia.  Plan to reevaluate of structure and function with echo.   - ECHOCARDIOGRAM COMPLETE; Future  9.  Depression screening Tabitha Golden had a positive depression screening. Depression is commonly associated with obesity and often results in emotional eating behaviors. We will monitor this closely and work on CBT to help improve the non-hunger eating patterns. Referral to Psychology may be required if no improvement is seen as she continues in our clinic.  10. BMI 50.0-59.9, adult  11. Obesity with starting BMI of 55.8 Avaleen is currently in the action stage of change and her goal is to continue with weight loss efforts. I recommend Tabitha Golden begin the structured treatment plan as follows:  She has agreed to the Category 4 Plan.  Exercise goals: No exercise has been prescribed at this time.   Behavioral modification strategies: increasing lean protein intake, meal planning and cooking strategies, keeping healthy foods in the home, and planning for success.  She was informed of the importance of frequent follow-up visits to maximize her success with intensive lifestyle modifications for her multiple health conditions. She was informed we would discuss her lab results at her next visit unless there is a critical issue that needs to be addressed sooner. Tabitha Golden agreed to keep her next visit at the agreed upon time to discuss these results.  Objective:   Blood pressure (!) 162/92, pulse 81, temperature (!) 97.5 F (  36.4 C), height  (1.727 m), weight (!) 366 lb (166 kg), last menstrual period 10/01/2022, SpO2 97 %. Body mass index is 55.65 kg/m.  EKG: Normal sinus rhythm, rate 69 bpm.  Indirect Calorimeter completed today shows a VO2 of 375 and a REE of 2592.  Her calculated basal metabolic rate is 4332 thus her basal metabolic rate is better than expected.  General: Cooperative, alert, well developed, in no acute distress. HEENT: Conjunctivae and lids unremarkable. Cardiovascular: Regular rhythm.  Lungs: Normal work of breathing. Neurologic: No focal deficits.   Lab Results  Component Value Date    CREATININE 0.75 10/03/2022   BUN 12 10/03/2022   NA 140 10/03/2022   K 3.9 10/03/2022   CL 101 10/03/2022   CO2 21 10/03/2022   Lab Results  Component Value Date   ALT 23 10/03/2022   AST 20 10/03/2022   ALKPHOS 95 10/03/2022   BILITOT 0.3 10/03/2022   Lab Results  Component Value Date   HGBA1C 6.2 (H) 10/03/2022   Lab Results  Component Value Date   INSULIN 68.5 (H) 10/03/2022   Lab Results  Component Value Date   TSH 2.920 10/03/2022   Lab Results  Component Value Date   CHOL 152 10/03/2022   HDL 34 (L) 10/03/2022   LDLCALC 97 10/03/2022   TRIG 116 10/03/2022   Lab Results  Component Value Date   WBC 7.5 10/03/2022   HGB 12.4 10/03/2022   HCT 38.7 10/03/2022   MCV 76 (L) 10/03/2022   PLT 350 10/03/2022   Lab Results  Component Value Date   IRON 31 10/03/2022   TIBC 393 10/03/2022   FERRITIN 21 10/03/2022   Attestation Statements:   Reviewed by clinician on day of visit: allergies, medications, problem list, medical history, surgical history, family history, social history, and previous encounter notes.  Time spent on visit including pre-visit chart review and Golden-visit charting and care was 60 minutes.   I, Malcolm Metro, RMA, am acting as transcriptionist for Reuben Likes, MD.  This is the patient's first visit at [facility]. The patient's NEW PATIENT PACKET was reviewed at length. Included in the packet: current and past health history, medications, allergies, ROS, gynecologic history (women only), surgical history, family history, social history, weight history, weight loss surgery history (for those that have had weight loss surgery), nutritional evaluation, mood and food questionnaire, PHQ9, Epworth questionnaire, sleep habits questionnaire, patient life and health improvement goals questionnaire. These will all be scanned into the patient's chart under media.   During the visit, I independently reviewed the patient's EKG,  bioimpedance scale results, and indirect calorimeter results. I used this information to tailor a meal plan for the patient that will help her to lose weight and will improve her obesity-related conditions going forward. I performed a medically necessary appropriate examination and/or evaluation. I discussed the assessment and treatment plan with the patient. The patient was provided an opportunity to ask questions and all were answered. The patient agreed with the plan and demonstrated an understanding of the instructions. Labs were ordered at this visit and will be reviewed at the next visit unless more critical results need to be addressed immediately. Clinical information was updated and documented in the EMR.   Time spent on visit including pre-visit chart review and Golden-visit care was 60 minutes.  An additional 10 minutes was spent on counseling as listed above. I have reviewed the above documentation for accuracy and completeness, and I agree with  the above. - Coralie Common, MD

## 2022-10-04 LAB — COMPREHENSIVE METABOLIC PANEL
Alkaline Phosphatase: 95 IU/L (ref 44–121)
BUN/Creatinine Ratio: 16 (ref 9–23)
Total Protein: 6.7 g/dL (ref 6.0–8.5)

## 2022-10-04 LAB — HEMOGLOBIN A1C: Hgb A1c MFr Bld: 6.2 % — ABNORMAL HIGH (ref 4.8–5.6)

## 2022-10-04 LAB — CBC WITH DIFFERENTIAL/PLATELET
Basos: 1 %
Eos: 2 %
Hemoglobin: 12.4 g/dL (ref 11.1–15.9)
MCHC: 32 g/dL (ref 31.5–35.7)
Monocytes Absolute: 0.4 10*3/uL (ref 0.1–0.9)
Neutrophils: 72 %
RBC: 5.08 x10E6/uL (ref 3.77–5.28)

## 2022-10-04 LAB — ANEMIA PANEL
Iron Saturation: 8 % — CL (ref 15–55)
Iron: 31 ug/dL (ref 27–159)
Total Iron Binding Capacity: 393 ug/dL (ref 250–450)

## 2022-10-04 LAB — INSULIN, RANDOM: INSULIN: 68.5 u[IU]/mL — ABNORMAL HIGH (ref 2.6–24.9)

## 2022-10-04 LAB — T3: T3, Total: 174 ng/dL (ref 71–180)

## 2022-10-04 LAB — T4, FREE: Free T4: 0.96 ng/dL (ref 0.82–1.77)

## 2022-10-05 LAB — ANEMIA PANEL
Ferritin: 21 ng/mL (ref 15–150)
Folate, Hemolysate: 364 ng/mL
Folate, RBC: 941 ng/mL (ref >498)
Hematocrit: 38.7 % (ref 34.0–46.6)
Retic Ct Pct: 1.6 % (ref 0.6–2.6)
UIBC: 362 ug/dL (ref 131–425)
Vitamin B-12: 678 pg/mL (ref 232–1245)

## 2022-10-05 LAB — LIPID PANEL WITH LDL/HDL RATIO
Cholesterol, Total: 152 mg/dL (ref 100–199)
HDL: 34 mg/dL — ABNORMAL LOW (ref >39)
LDL Chol Calc (NIH): 97 mg/dL (ref 0–99)
LDL/HDL Ratio: 2.9 ratio (ref 0.0–3.2)
Triglycerides: 116 mg/dL (ref 0–149)
VLDL Cholesterol Cal: 21 mg/dL (ref 5–40)

## 2022-10-05 LAB — COMPREHENSIVE METABOLIC PANEL
ALT: 23 IU/L (ref 0–32)
AST: 20 IU/L (ref 0–40)
Albumin/Globulin Ratio: 1.5 (ref 1.2–2.2)
Albumin: 4 g/dL (ref 3.9–4.9)
BUN: 12 mg/dL (ref 6–20)
Bilirubin Total: 0.3 mg/dL (ref 0.0–1.2)
CO2: 21 mmol/L (ref 20–29)
Calcium: 8.6 mg/dL — ABNORMAL LOW (ref 8.7–10.2)
Chloride: 101 mmol/L (ref 96–106)
Creatinine, Ser: 0.75 mg/dL (ref 0.57–1.00)
Globulin, Total: 2.7 g/dL (ref 1.5–4.5)
Glucose: 92 mg/dL (ref 70–99)
Potassium: 3.9 mmol/L (ref 3.5–5.2)
Sodium: 140 mmol/L (ref 134–144)
eGFR: 104 mL/min/{1.73_m2} (ref >59)

## 2022-10-05 LAB — CBC WITH DIFFERENTIAL/PLATELET
Basophils Absolute: 0.1 10*3/uL (ref 0.0–0.2)
EOS (ABSOLUTE): 0.2 10*3/uL (ref 0.0–0.4)
Immature Grans (Abs): 0 10*3/uL (ref 0.0–0.1)
Immature Granulocytes: 1 %
Lymphocytes Absolute: 1.4 10*3/uL (ref 0.7–3.1)
Lymphs: 19 %
MCH: 24.4 pg — ABNORMAL LOW (ref 26.6–33.0)
MCV: 76 fL — ABNORMAL LOW (ref 79–97)
Monocytes: 5 %
Neutrophils Absolute: 5.5 10*3/uL (ref 1.4–7.0)
Platelets: 350 10*3/uL (ref 150–450)
RDW: 14.8 % (ref 11.7–15.4)
WBC: 7.5 10*3/uL (ref 3.4–10.8)

## 2022-10-05 LAB — VITAMIN D 25 HYDROXY (VIT D DEFICIENCY, FRACTURES): Vit D, 25-Hydroxy: 19 ng/mL — ABNORMAL LOW (ref 30.0–100.0)

## 2022-10-05 LAB — TSH: TSH: 2.92 u[IU]/mL (ref 0.450–4.500)

## 2022-10-05 LAB — HEMOGLOBIN A1C: Est. average glucose Bld gHb Est-mCnc: 131 mg/dL

## 2022-10-05 LAB — FOLATE: Folate: 6.8 ng/mL (ref >3.0)

## 2022-10-18 ENCOUNTER — Encounter (INDEPENDENT_AMBULATORY_CARE_PROVIDER_SITE_OTHER): Payer: Self-pay | Admitting: Family Medicine

## 2022-10-18 ENCOUNTER — Ambulatory Visit (INDEPENDENT_AMBULATORY_CARE_PROVIDER_SITE_OTHER): Payer: No Typology Code available for payment source | Admitting: Family Medicine

## 2022-10-18 VITALS — BP 142/86 | HR 77 | Temp 98.8°F | Ht 68.0 in | Wt 359.0 lb

## 2022-10-18 DIAGNOSIS — D508 Other iron deficiency anemias: Secondary | ICD-10-CM | POA: Diagnosis not present

## 2022-10-18 DIAGNOSIS — I10 Essential (primary) hypertension: Secondary | ICD-10-CM

## 2022-10-18 DIAGNOSIS — E559 Vitamin D deficiency, unspecified: Secondary | ICD-10-CM

## 2022-10-18 DIAGNOSIS — R7303 Prediabetes: Secondary | ICD-10-CM | POA: Diagnosis not present

## 2022-10-18 DIAGNOSIS — E669 Obesity, unspecified: Secondary | ICD-10-CM

## 2022-10-18 DIAGNOSIS — Z6841 Body Mass Index (BMI) 40.0 and over, adult: Secondary | ICD-10-CM

## 2022-10-18 MED ORDER — VITAMIN D (ERGOCALCIFEROL) 1.25 MG (50000 UNIT) PO CAPS
50000.0000 [IU] | ORAL_CAPSULE | ORAL | 0 refills | Status: DC
Start: 2022-10-18 — End: 2022-11-27

## 2022-10-18 NOTE — Progress Notes (Signed)
Chief Complaint:   OBESITY Tabitha Golden is here to discuss her progress with her obesity treatment plan along with follow-up of her obesity related diagnoses. Tabitha Golden is on the Category 4 Plan and states she is following her eating plan approximately 90% of the time. Tabitha Golden states she is not exercising.  Today's visit was #: 2 Starting weight: 366 lbs Starting date: 10/03/2022 Today's weight: 359 lbs Today's date: 10/18/2022 Total lbs lost to date: 7 lbs Total lbs lost since last in-office visit: 7 lbs  Interim History: Patient felt the first few weeks was really good.  She drinks coffee in the am and moved the milk to the am.  She is wondering about moving food around.  She has been trying to get creative with vegetables and protein. She has done stuffed peppers with group [place]. She is going to the beach with her family this weekend.  She is wondering how to make the plan work.  She is feeling much better and could tell a difference in the food choices she was making. Wondering what other options she can do in substitution for breakfast in the am.   Subjective:   1. Essential hypertension Patient blood pressure slightly controlled today.  Patient denies chest pain, chest pressure, headache.   2. Vitamin D deficiency Patient vitamin D level was 19.  Patient is not on any supplements.  Patient is positive for fatigue.  3. Prediabetes Patient last A1c was 6.2, insulin >60, significant elevation.  Patient is not on any medications.  Carb cravings are well-controlled.  Patient previously tried GLP-1 with significant side effects.  4. Other iron deficiency anemia MCV 76, H&H, WNL, iron saturation 8.  Insurance would not pay for fusion plus.  Assessment/Plan:   1. Essential hypertension Continue Hyzaar at same dose, no change in dose.  2. Vitamin D deficiency Start- Vitamin D, Ergocalciferol, (DRISDOL) 1.25 MG (50000 UNIT) CAPS capsule; Take 1 capsule (50,000 Units total) by mouth every 7  (seven) days.  Dispense: 4 capsule; Refill: 0  3. Prediabetes Continue category 4, pathophysiology of insulin resistance, prediabetes and diabetes discussed with patient today.  4. Other iron deficiency anemia Stop fusion plus.  Start ferrous sulfate 325 mg p.o. daily, OTC.  5. BMI 50.0-59.9, adult (HCC)  6. Obesity with starting BMI of 55.8 Tabitha Golden is currently in the action stage of change. As such, her goal is to continue with weight loss efforts. She has agreed to the Category 4 Plan and keeping a food journal and adhering to recommended goals of 550-700 calories and 50+ protein at supper.   Exercise goals: All adults should avoid inactivity. Some physical activity is better than none, and adults who participate in any amount of physical activity gain some health benefits.  Behavioral modification strategies: increasing lean protein intake, meal planning and cooking strategies, keeping healthy foods in the home, and planning for success.  Tabitha Golden has agreed to follow-up with our clinic in 2 weeks. She was informed of the importance of frequent follow-up visits to maximize her success with intensive lifestyle modifications for her multiple health conditions.   Objective:   Blood pressure (!) 142/86, pulse 77, temperature 98.8 F (37.1 C), height 5\' 8"  (1.727 m), weight (!) 359 lb (162.8 kg), last menstrual period 10/01/2022, SpO2 97 %. Body mass index is 54.59 kg/m.  General: Cooperative, alert, well developed, in no acute distress. HEENT: Conjunctivae and lids unremarkable. Cardiovascular: Regular rhythm.  Lungs: Normal work of breathing. Neurologic: No focal deficits.  Lab Results  Component Value Date   CREATININE 0.75 10/03/2022   BUN 12 10/03/2022   NA 140 10/03/2022   K 3.9 10/03/2022   CL 101 10/03/2022   CO2 21 10/03/2022   Lab Results  Component Value Date   ALT 23 10/03/2022   AST 20 10/03/2022   ALKPHOS 95 10/03/2022   BILITOT 0.3 10/03/2022   Lab Results   Component Value Date   HGBA1C 6.2 (H) 10/03/2022   Lab Results  Component Value Date   INSULIN 68.5 (H) 10/03/2022   Lab Results  Component Value Date   TSH 2.920 10/03/2022   Lab Results  Component Value Date   CHOL 152 10/03/2022   HDL 34 (L) 10/03/2022   LDLCALC 97 10/03/2022   TRIG 116 10/03/2022   Lab Results  Component Value Date   VD25OH 19.0 (L) 10/03/2022   Lab Results  Component Value Date   WBC 7.5 10/03/2022   HGB 12.4 10/03/2022   HCT 38.7 10/03/2022   MCV 76 (L) 10/03/2022   PLT 350 10/03/2022   Lab Results  Component Value Date   IRON 31 10/03/2022   TIBC 393 10/03/2022   FERRITIN 21 10/03/2022    Attestation Statements:   Reviewed by clinician on day of visit: allergies, medications, problem list, medical history, surgical history, family history, social history, and previous encounter notes.  Time spent on visit including pre-visit chart review and post-visit care and charting was 45 minutes.   I, Malcolm Metro, RMA, am acting as transcriptionist for Reuben Likes, MD. I have reviewed the above documentation for accuracy and completeness, and I agree with the above. - Reuben Likes, MD

## 2022-10-29 ENCOUNTER — Ambulatory Visit (HOSPITAL_COMMUNITY)
Admission: RE | Admit: 2022-10-29 | Discharge: 2022-10-29 | Disposition: A | Discharge status: Home / Self Care | Payer: No Typology Code available for payment source | Source: Ambulatory Visit | Attending: Family Medicine | Admitting: Family Medicine

## 2022-10-29 DIAGNOSIS — R012 Other cardiac sounds: Secondary | ICD-10-CM

## 2022-10-29 DIAGNOSIS — K219 Gastro-esophageal reflux disease without esophagitis: Secondary | ICD-10-CM | POA: Diagnosis not present

## 2022-10-29 DIAGNOSIS — F17200 Nicotine dependence, unspecified, uncomplicated: Secondary | ICD-10-CM | POA: Insufficient documentation

## 2022-10-29 DIAGNOSIS — J449 Chronic obstructive pulmonary disease, unspecified: Secondary | ICD-10-CM | POA: Diagnosis not present

## 2022-10-29 DIAGNOSIS — I10 Essential (primary) hypertension: Secondary | ICD-10-CM

## 2022-10-29 DIAGNOSIS — I2510 Atherosclerotic heart disease of native coronary artery without angina pectoris: Secondary | ICD-10-CM | POA: Diagnosis not present

## 2022-10-29 DIAGNOSIS — Z9189 Other specified personal risk factors, not elsewhere classified: Secondary | ICD-10-CM

## 2022-10-29 LAB — ECHOCARDIOGRAM COMPLETE
Area-P 1/2: 2.8 cm2
Calc EF: 47.6 %
Radius: 0.3 cm
S' Lateral: 3.9 cm
Single Plane A2C EF: 51.1 %
Single Plane A4C EF: 46.3 %

## 2022-10-29 NOTE — Progress Notes (Signed)
Echocardiogram 2D Echocardiogram has been performed.  Tabitha Golden 10/29/2022, 12:20 PM

## 2022-10-30 ENCOUNTER — Other Ambulatory Visit (INDEPENDENT_AMBULATORY_CARE_PROVIDER_SITE_OTHER): Payer: Self-pay | Admitting: Family Medicine

## 2022-10-30 DIAGNOSIS — F339 Major depressive disorder, recurrent, unspecified: Secondary | ICD-10-CM

## 2022-11-08 ENCOUNTER — Ambulatory Visit (INDEPENDENT_AMBULATORY_CARE_PROVIDER_SITE_OTHER): Payer: No Typology Code available for payment source | Admitting: Family Medicine

## 2022-11-08 ENCOUNTER — Encounter (INDEPENDENT_AMBULATORY_CARE_PROVIDER_SITE_OTHER): Payer: Self-pay | Admitting: Family Medicine

## 2022-11-08 VITALS — BP 158/100 | HR 79 | Temp 98.2°F | Ht 68.0 in | Wt 352.0 lb

## 2022-11-08 DIAGNOSIS — R7303 Prediabetes: Secondary | ICD-10-CM

## 2022-11-08 DIAGNOSIS — Z6841 Body Mass Index (BMI) 40.0 and over, adult: Secondary | ICD-10-CM | POA: Diagnosis not present

## 2022-11-08 DIAGNOSIS — E669 Obesity, unspecified: Secondary | ICD-10-CM | POA: Diagnosis not present

## 2022-11-08 DIAGNOSIS — I10 Essential (primary) hypertension: Secondary | ICD-10-CM | POA: Diagnosis not present

## 2022-11-08 MED ORDER — LOSARTAN POTASSIUM-HCTZ 100-12.5 MG PO TABS
1.0000 | ORAL_TABLET | Freq: Every day | ORAL | 0 refills | Status: DC
Start: 2022-11-08 — End: 2022-11-27

## 2022-11-08 NOTE — Progress Notes (Signed)
Chief Complaint:   OBESITY [name] is here to discuss her progress with her obesity treatment plan along with follow-up of her obesity related diagnoses. Tabitha Golden is on the Category 4 Plan and keeping a food journal and adhering to recommended goals of 550-700 calories and 50+ grams of protein and states she is following her eating plan approximately 97% of the time. Tabitha Golden states she is doing 0 minutes 0 times per week.  Today's visit was #: 3 Starting weight: 366 lbs Starting date: 10/03/2022 Today's weight: 352 lbs Today's date: 11/08/2022 Total lbs lost to date: 14 Total lbs lost since last in-office visit: 7  Interim History: Patient has been following meal plan 97% of the time. Patient feels like this is the easiest thing she has ever done when it comes to food.  She feels like this is real world and likes that she eats real food.  Yesterday or the day before she fell asleep before dinner and ended up eating in the middle of the night. Ate a cupcake for Mother's Day and felt like it was business as usual. She had more cravings this week with her period and felt more hungry.  For Memorial Day she isn't sure what she is going to do; she may go to the beach.   Subjective:   1. Essential hypertension Patient's blood pressure is still elevated today.  She is on Hyzaar 50-12.5 mg.  Echocardiogram done on 10/29/2022, showing low normal EF, otherwise normal.  2. Prediabetes Patient's last A1c was 6.2 and insulin 68.5.  She is not on medications.  Assessment/Plan:   1. Essential hypertension Patient agreed to increase Hyzaar to 100-12.5 mg daily, and we will refill for 1 month.  - losartan-hydrochlorothiazide (HYZAAR) 100-12.5 MG tablet; Take 1 tablet by mouth daily.  Dispense: 30 tablet; Refill: 0  2. Prediabetes We will repeat labs in 2 months.  3. BMI 50.0-59.9, adult (HCC)  4. Obesity with starting BMI of 55.8 Tabitha Golden is currently in the action stage of change. As such, her goal is  to continue with weight loss efforts. She has agreed to the Category 4 Plan and keeping a food journal and adhering to recommended goals of 500-700 calories and 50+ grams of protein supper daily.   Breakfast and lunch handouts were given.  Exercise goals: No exercise has been prescribed at this time.  Behavioral modification strategies: increasing lean protein intake, meal planning and cooking strategies, keeping healthy foods in the home, and planning for success.  Tabitha Golden has agreed to follow-up with our clinic in 2 weeks. She was informed of the importance of frequent follow-up visits to maximize her success with intensive lifestyle modifications for her multiple health conditions.   Objective:   Blood pressure (!) 158/100, pulse 79, temperature 98.2 F (36.8 C), height 5\' 8"  (1.727 m), weight (!) 352 lb (159.7 kg), SpO2 99 %. Body mass index is 53.52 kg/m.  General: Cooperative, alert, well developed, in no acute distress. HEENT: Conjunctivae and lids unremarkable. Cardiovascular: Regular rhythm.  Lungs: Normal work of breathing. Neurologic: No focal deficits.   Lab Results  Component Value Date   CREATININE 0.75 10/03/2022   BUN 12 10/03/2022   NA 140 10/03/2022   K 3.9 10/03/2022   CL 101 10/03/2022   CO2 21 10/03/2022   Lab Results  Component Value Date   ALT 23 10/03/2022   AST 20 10/03/2022   ALKPHOS 95 10/03/2022   BILITOT 0.3 10/03/2022   Lab Results  Component Value Date   HGBA1C 6.2 (H) 10/03/2022   Lab Results  Component Value Date   INSULIN 68.5 (H) 10/03/2022   Lab Results  Component Value Date   TSH 2.920 10/03/2022   Lab Results  Component Value Date   CHOL 152 10/03/2022   HDL 34 (L) 10/03/2022   LDLCALC 97 10/03/2022   TRIG 116 10/03/2022   Lab Results  Component Value Date   VD25OH 19.0 (L) 10/03/2022   Lab Results  Component Value Date   WBC 7.5 10/03/2022   HGB 12.4 10/03/2022   HCT 38.7 10/03/2022   MCV 76 (L) 10/03/2022   PLT  350 10/03/2022   Lab Results  Component Value Date   IRON 31 10/03/2022   TIBC 393 10/03/2022   FERRITIN 21 10/03/2022   Attestation Statements:   Reviewed by clinician on day of visit: allergies, medications, problem list, medical history, surgical history, family history, social history, and previous encounter notes.   I, Burt Knack, am acting as transcriptionist for Reuben Likes, MD.  I have reviewed the above documentation for accuracy and completeness, and I agree with the above. - Reuben Likes, MD

## 2022-11-18 ENCOUNTER — Other Ambulatory Visit (INDEPENDENT_AMBULATORY_CARE_PROVIDER_SITE_OTHER): Payer: Self-pay | Admitting: Family Medicine

## 2022-11-18 DIAGNOSIS — E559 Vitamin D deficiency, unspecified: Secondary | ICD-10-CM

## 2022-11-27 ENCOUNTER — Encounter (INDEPENDENT_AMBULATORY_CARE_PROVIDER_SITE_OTHER): Payer: Self-pay | Admitting: Physician Assistant

## 2022-11-27 ENCOUNTER — Ambulatory Visit (INDEPENDENT_AMBULATORY_CARE_PROVIDER_SITE_OTHER): Payer: No Typology Code available for payment source | Admitting: Physician Assistant

## 2022-11-27 VITALS — BP 143/87 | HR 75 | Temp 98.2°F | Ht 68.0 in | Wt 348.0 lb

## 2022-11-27 DIAGNOSIS — Z6841 Body Mass Index (BMI) 40.0 and over, adult: Secondary | ICD-10-CM

## 2022-11-27 DIAGNOSIS — I10 Essential (primary) hypertension: Secondary | ICD-10-CM

## 2022-11-27 DIAGNOSIS — R7303 Prediabetes: Secondary | ICD-10-CM

## 2022-11-27 DIAGNOSIS — E559 Vitamin D deficiency, unspecified: Secondary | ICD-10-CM

## 2022-11-27 DIAGNOSIS — F339 Major depressive disorder, recurrent, unspecified: Secondary | ICD-10-CM

## 2022-11-27 DIAGNOSIS — E669 Obesity, unspecified: Secondary | ICD-10-CM

## 2022-11-27 MED ORDER — LOSARTAN POTASSIUM-HCTZ 100-12.5 MG PO TABS: 1.0000 | ORAL_TABLET | Freq: Every day | ORAL | 0 refills | Status: DC

## 2022-11-27 MED ORDER — VITAMIN D (ERGOCALCIFEROL) 1.25 MG (50000 UNIT) PO CAPS: 50000.0000 [IU] | ORAL_CAPSULE | ORAL | 0 refills | Status: DC

## 2022-11-27 NOTE — Progress Notes (Unsigned)
.smr  Office: [phone]  /  Fax: [phone]  WEIGHT SUMMARY AND BIOMETRICS  Vitals Temp: 98.2 F (36.8 C) BP: (!) 143/87 Pulse Rate: 75 SpO2: 98 %   Anthropometric Measurements Height: 5\' 8"  (1.727 m) Weight: (!) 348 lb (157.9 kg) BMI (Calculated): 52.93 Weight at Last Visit: 352 lb Weight Lost Since Last Visit: 4 lb Weight Gained Since Last Visit: 0 lb Starting Weight: 366 lb   Body Composition  Body Fat %: 53.7 % Fat Mass (lbs): 187.4 lbs Muscle Mass (lbs): 153.2 lbs Total Body Water (lbs): 119.8 lbs Visceral Fat Rating : 19   Other Clinical Data Fasting: no Labs: no Today's Visit #: 4 Starting Date: 10/03/22     HPI  Chief Complaint: OBESITY  Tabitha Golden is here to discuss her progress with her obesity treatment plan. She is on the {MWMwtlossportion/plan2:23431} and states she is following her eating plan approximately *** % of the time. She states she is exercising *** minutes *** times per week.   Interval History:  Since last office visit she *** Hunger/appetite-*** Cravings- *** Stress- *** Sleep- *** Exercise-*** Hydration-***   Pharmacotherapy: ***  TREATMENT PLAN FOR OBESITY:  Recommended Dietary Goals  Lexa is currently in the action stage of change. As such, her goal is to continue weight management plan. She has agreed to {MWMwtlossportion/plan2:23431}.  Behavioral Intervention  We discussed the following Behavioral Modification Strategies today: {EMWMwtlossstrategies:28914::"increasing lean protein intake","decreasing simple carbohydrates ","increasing vegetables","increasing lower glycemic fruits","increasing water intake","continue to practice mindfulness when eating","planning for success"}.  Additional resources provided today: NA  Recommended Physical Activity Goals  [name] has been advised to work up to 150 minutes of moderate intensity aerobic activity a week and strengthening exercises 2-3 times per week for  cardiovascular health, weight loss maintenance and preservation of muscle mass.   She has agreed to {EMEXERCISE:28847::"Think about ways to increase daily physical activity and overcoming barriers to exercise"}   Pharmacotherapy We discussed various medication options to help Tabitha Golden with her weight loss efforts and we both agreed to ***.    Return in about 2 weeks (around 12/11/2022).Marland Kitchen She was informed of the importance of frequent follow up visits to maximize her success with intensive lifestyle modifications for her multiple health conditions.  PHYSICAL EXAM:  Blood pressure (!) 143/87, pulse 75, temperature 98.2 F (36.8 C), height 5\' 8"  (1.727 m), weight (!) 348 lb (157.9 kg), SpO2 98 %. Body mass index is 52.91 kg/m.  General: She is overweight, cooperative, alert, well developed, and in no acute distress. PSYCH: Has normal mood, affect and thought process.   Lungs: Normal breathing effort, no conversational dyspnea.  DIAGNOSTIC DATA REVIEWED:  BMET    Component Value Date/Time   NA 140 10/03/2022 0957   K 3.9 10/03/2022 0957   CL 101 10/03/2022 0957   CO2 21 10/03/2022 0957   GLUCOSE 92 10/03/2022 0957   GLUCOSE 147 (H) 03/20/2020 1628   BUN 12 10/03/2022 0957   CREATININE 0.75 10/03/2022 0957   CALCIUM 8.6 (L) 10/03/2022 0957   GFRNONAA >60 03/20/2020 1628   GFRAA >60 03/20/2020 1628   Lab Results  Component Value Date   HGBA1C 6.2 (H) 10/03/2022   Lab Results  Component Value Date   INSULIN 68.5 (H) 10/03/2022   Lab Results  Component Value Date   TSH 2.920 10/03/2022   CBC    Component Value Date/Time   WBC 7.5 10/03/2022 0957   WBC 8.3 03/20/2020 1628   RBC 5.08 10/03/2022 0957  RBC 4.98 03/20/2020 1628   HGB 12.4 10/03/2022 0957   HCT 38.7 10/03/2022 0957   PLT 350 10/03/2022 0957   MCV 76 (L) 10/03/2022 0957   MCH 24.4 (L) 10/03/2022 0957   MCH 23.7 (L) 03/20/2020 1628   MCHC 32.0 10/03/2022 0957   MCHC 32.9 03/20/2020 1628   RDW 14.8  10/03/2022 0957   Iron Studies    Component Value Date/Time   IRON 31 10/03/2022 0957   TIBC 393 10/03/2022 0957   FERRITIN 21 10/03/2022 0957   IRONPCTSAT 8 (LL) 10/03/2022 0957   Lipid Panel     Component Value Date/Time   CHOL 152 10/03/2022 0957   TRIG 116 10/03/2022 0957   HDL 34 (L) 10/03/2022 0957   LDLCALC 97 10/03/2022 0957   Hepatic Function Panel     Component Value Date/Time   PROT 6.7 10/03/2022 0957   ALBUMIN 4.0 10/03/2022 0957   AST 20 10/03/2022 0957   ALT 23 10/03/2022 0957   ALKPHOS 95 10/03/2022 0957   BILITOT 0.3 10/03/2022 0957      Component Value Date/Time   TSH 2.920 10/03/2022 0957   Nutritional Lab Results  Component Value Date   VD25OH 19.0 (L) 10/03/2022    ASSOCIATED CONDITIONS ADDRESSED TODAY  ASSESSMENT AND PLAN  Problem List Items Addressed This Visit   None Visit Diagnoses     Essential hypertension       Relevant Medications   losartan-hydrochlorothiazide (HYZAAR) 100-12.5 MG tablet   Vitamin D deficiency       Relevant Medications   Vitamin D, Ergocalciferol, (DRISDOL) 1.25 MG (50000 UNIT) CAPS capsule     Hypertension Hypertension {disease control degree:315147}.  Medication(s): ***  BP Readings from Last 3 Encounters:  11/27/22 (!) 143/87  11/08/22 (!) 158/100  10/18/22 (!) 142/86   Lab Results  Component Value Date   CREATININE 0.75 10/03/2022   CREATININE 0.81 03/20/2020   CREATININE 0.76 12/06/2015   No results found for: "GFR"  Plan: Continue all antihypertensives at current dosages.   Vitamin D Deficiency Vitamin D {CHL AMB IS/IS NOT:210130109} at goal of 50.  Most recent vitamin D level was ***. She is on {dwwslvitdrx:29084}. Lab Results  Component Value Date   VD25OH 19.0 (L) 10/03/2022    Plan: {dwwmed:29123} {dwwslvitdrx:29084}   {dwwee:29148}/emotional eating Hailie has had issues with stress/emotional eating. Currently this is {EWCONTROLASSESSMENT:24261}. Overall mood {ACTION;  IS/IS NOT:21021397} stable. Medication(s): {dwweemeds:29150}  Plan: {dwwmed:29123} {dwweemeds:29150} Referral to Dr. Dewaine Conger.   ATTESTASTION STATEMENTS:  Reviewed by clinician on day of visit: allergies, medications, problem list, medical history, surgical history, family history, social history, and previous encounter notes.   I have personally spent 30 minutes total time today in preparation, patient care, nutritional counseling and documentation for this visit, including the following: review of clinical lab tests; review of medical tests/procedures/services.      Ica Daye, PA-C

## 2022-12-11 ENCOUNTER — Other Ambulatory Visit (INDEPENDENT_AMBULATORY_CARE_PROVIDER_SITE_OTHER): Payer: Self-pay | Admitting: Physician Assistant

## 2022-12-11 DIAGNOSIS — E559 Vitamin D deficiency, unspecified: Secondary | ICD-10-CM

## 2022-12-12 ENCOUNTER — Ambulatory Visit (INDEPENDENT_AMBULATORY_CARE_PROVIDER_SITE_OTHER): Payer: No Typology Code available for payment source | Admitting: Family Medicine

## 2022-12-12 ENCOUNTER — Encounter (INDEPENDENT_AMBULATORY_CARE_PROVIDER_SITE_OTHER): Payer: Self-pay | Admitting: Family Medicine

## 2022-12-12 VITALS — BP 134/84 | HR 70 | Temp 98.1°F | Ht 68.0 in | Wt 345.0 lb

## 2022-12-12 DIAGNOSIS — I10 Essential (primary) hypertension: Secondary | ICD-10-CM

## 2022-12-12 DIAGNOSIS — Z6841 Body Mass Index (BMI) 40.0 and over, adult: Secondary | ICD-10-CM | POA: Diagnosis not present

## 2022-12-12 DIAGNOSIS — E669 Obesity, unspecified: Secondary | ICD-10-CM | POA: Diagnosis not present

## 2022-12-12 DIAGNOSIS — E559 Vitamin D deficiency, unspecified: Secondary | ICD-10-CM

## 2022-12-12 MED ORDER — VITAMIN D (ERGOCALCIFEROL) 1.25 MG (50000 UNIT) PO CAPS: 50000.0000 [IU] | ORAL_CAPSULE | ORAL | 0 refills | Status: DC

## 2022-12-12 NOTE — Progress Notes (Signed)
Chief Complaint:   OBESITY Tabitha Golden is here to discuss her progress with her obesity treatment plan along with follow-up of her obesity related diagnoses. Tabitha Golden is on the Category 4 Plan and states she is following her eating plan approximately 92% of the time. Tabitha Golden states she is doing pool exercise for 30 minutes 3 times per week.  Today's visit was #: 5 Starting weight: 366 lbs Starting date: 10/03/2022 Today's weight: 345 lbs Today's date: 12/12/2022 Total lbs lost to date: 21 Total lbs lost since last in-office visit: 3  Interim History: Since last appointment patient has been mostly living life per usual.  She is still following meal plan that she started with.  She mentions her family has been very busy so she has stuck with the meal plan that she knows.  She is going to the beach for the week over July 4th.  She will be able to cook and is anticipating being able to continue following meal plan.  She found sugar free candy at [facility] and has been losing a bit of control over consumption of those.   Subjective:   1. Vitamin D deficiency Patient is on prescription vitamin D.  She denies nausea, vomiting, or muscle weakness but notes fatigue.  2. Essential hypertension Patient's blood pressure is better controlled today.  She denies chest pain, chest pressure, or headache.  Assessment/Plan:   1. Vitamin D deficiency We will refill prescription vitamin D once weekly for 1 month.  - Vitamin D, Ergocalciferol, (DRISDOL) 1.25 MG (50000 UNIT) CAPS capsule; Take 1 capsule (50,000 Units total) by mouth every 7 (seven) days.  Dispense: 4 capsule; Refill: 0  2. Essential hypertension Patient will continue Hyzaar at current dose; blood pressure at goal today.  3. BMI 50.0-59.9, adult (HCC) Current BMI 53.0  4. Obesity with starting BMI of 55.8 Tabitha Golden is currently in the action stage of change. As such, her goal is to continue with weight loss efforts. She has agreed to the  Category 4 Plan.   Exercise goals: As is.   Behavioral modification strategies: increasing lean protein intake, meal planning and cooking strategies, keeping healthy foods in the home, and planning for success.  Tabitha Golden has agreed to follow-up with our clinic in 3 weeks. She was informed of the importance of frequent follow-up visits to maximize her success with intensive lifestyle modifications for her multiple health conditions.   Objective:   Blood pressure 134/84, pulse 70, temperature 98.1 F (36.7 C), height 5\' 8"  (1.727 m), weight (!) 345 lb (156.5 kg), SpO2 98 %. Body mass index is 52.46 kg/m.  General: Cooperative, alert, well developed, in no acute distress. HEENT: Conjunctivae and lids unremarkable. Cardiovascular: Regular rhythm.  Lungs: Normal work of breathing. Neurologic: No focal deficits.   Lab Results  Component Value Date   CREATININE 0.75 10/03/2022   BUN 12 10/03/2022   NA 140 10/03/2022   K 3.9 10/03/2022   CL 101 10/03/2022   CO2 21 10/03/2022   Lab Results  Component Value Date   ALT 23 10/03/2022   AST 20 10/03/2022   ALKPHOS 95 10/03/2022   BILITOT 0.3 10/03/2022   Lab Results  Component Value Date   HGBA1C 6.2 (H) 10/03/2022   Lab Results  Component Value Date   INSULIN 68.5 (H) 10/03/2022   Lab Results  Component Value Date   TSH 2.920 10/03/2022   Lab Results  Component Value Date   CHOL 152 10/03/2022   HDL  34 (L) 10/03/2022   LDLCALC 97 10/03/2022   TRIG 116 10/03/2022   Lab Results  Component Value Date   VD25OH 19.0 (L) 10/03/2022   Lab Results  Component Value Date   WBC 7.5 10/03/2022   HGB 12.4 10/03/2022   HCT 38.7 10/03/2022   MCV 76 (L) 10/03/2022   PLT 350 10/03/2022   Lab Results  Component Value Date   IRON 31 10/03/2022   TIBC 393 10/03/2022   FERRITIN 21 10/03/2022   Attestation Statements:   Reviewed by clinician on day of visit: allergies, medications, problem list, medical history, surgical  history, family history, social history, and previous encounter notes.   I, Burt Knack, am acting as transcriptionist for Reuben Likes, MD. I have reviewed the above documentation for accuracy and completeness, and I agree with the above. - Reuben Likes, MD

## 2022-12-26 ENCOUNTER — Encounter (INDEPENDENT_AMBULATORY_CARE_PROVIDER_SITE_OTHER): Payer: Self-pay | Admitting: Physician Assistant

## 2022-12-26 ENCOUNTER — Ambulatory Visit (INDEPENDENT_AMBULATORY_CARE_PROVIDER_SITE_OTHER): Payer: No Typology Code available for payment source | Admitting: Physician Assistant

## 2022-12-26 VITALS — BP 117/87 | HR 87 | Temp 98.1°F | Ht 68.0 in | Wt 345.0 lb

## 2022-12-26 DIAGNOSIS — D508 Other iron deficiency anemias: Secondary | ICD-10-CM | POA: Diagnosis not present

## 2022-12-26 DIAGNOSIS — F339 Major depressive disorder, recurrent, unspecified: Secondary | ICD-10-CM

## 2022-12-26 DIAGNOSIS — F39 Unspecified mood [affective] disorder: Secondary | ICD-10-CM | POA: Insufficient documentation

## 2022-12-26 DIAGNOSIS — I10 Essential (primary) hypertension: Secondary | ICD-10-CM

## 2022-12-26 DIAGNOSIS — Z6841 Body Mass Index (BMI) 40.0 and over, adult: Secondary | ICD-10-CM

## 2022-12-26 DIAGNOSIS — E669 Obesity, unspecified: Secondary | ICD-10-CM

## 2022-12-26 DIAGNOSIS — D649 Anemia, unspecified: Secondary | ICD-10-CM | POA: Insufficient documentation

## 2022-12-26 DIAGNOSIS — E559 Vitamin D deficiency, unspecified: Secondary | ICD-10-CM

## 2022-12-26 MED ORDER — VITAMIN D (ERGOCALCIFEROL) 1.25 MG (50000 UNIT) PO CAPS
50000.0000 [IU] | ORAL_CAPSULE | ORAL | 0 refills | Status: DC
Start: 2022-12-26 — End: 2023-01-09

## 2022-12-26 MED ORDER — SERTRALINE HCL 50 MG PO TABS: 50.0000 mg | ORAL_TABLET | Freq: Every day | ORAL | 0 refills | Status: DC

## 2022-12-26 MED ORDER — LOSARTAN POTASSIUM-HCTZ 100-12.5 MG PO TABS
1.0000 | ORAL_TABLET | Freq: Every day | ORAL | 0 refills | Status: DC
Start: 2022-12-26 — End: 2023-02-06

## 2022-12-26 NOTE — Progress Notes (Signed)
.smr  Office: [phone]  /  Fax: [phone]  WEIGHT SUMMARY AND BIOMETRICS  Vitals Temp: 98.1 F (36.7 C) BP: 117/87 Pulse Rate: 87 SpO2: 94 %   Anthropometric Measurements Height: 5\' 8"  (1.727 m) Weight: (!) 345 lb (156.5 kg) BMI (Calculated): 52.47 Weight at Last Visit: 345 lb Weight Lost Since Last Visit: 0 Weight Gained Since Last Visit: 0 Starting Weight: 366 lb Total Weight Loss (lbs): 21 lb (9.526 kg) Peak Weight: 366 lb   Body Composition  Body Fat %: 52.9 % Fat Mass (lbs): 183 lbs Muscle Mass (lbs): 154.6 lbs Total Body Water (lbs): 117 lbs Visceral Fat Rating : 19   Other Clinical Data Fasting: no Labs: no Today's Visit #: 6 Starting Date: 10/03/22     HPI  Chief Complaint: OBESITY  Tabitha Golden is here to discuss her progress with her obesity treatment plan. She is on the the Category 4 Plan and states she is following her eating plan approximately 75 % of the time. She states she is exercising 0 minutes 0 times per week.   Interval History:  Since last office visit she maintained weight loss. She is down 21 lbs overall. She was on vacation at the beach for a week and did indulge while vacationing but got back on track quickly afterwards.  Hunger/appetite-increased hunger around cycles  Cravings- increased for sweets at times.  Stress- Manageable. She works in [employer] and just got a promotion at work!  Sleep- Ave 6 hrs nightly. Discussed making sleep a priority Exercise-walking some while at the beach and looking into walking videos as well    Pharmacotherapy: Zoloft for emotional eating. No side effects. Mood stable.   TREATMENT PLAN FOR OBESITY:  Recommended Dietary Goals  Tabitha Golden is currently in the action stage of change. As such, her goal is to continue weight management plan. She has agreed to the Category 4 Plan.  Behavioral Intervention  We discussed the following Behavioral Modification Strategies today: increasing lean protein  intake, decreasing simple carbohydrates , increasing vegetables, increasing lower glycemic fruits, increasing water intake, emotional eating strategies and understanding the difference between hunger signals and cravings, continue to practice mindfulness when eating, planning for success, and staying on track while traveling and vacationing.  Additional resources provided today: NA  Recommended Physical Activity Goals  Tabitha Golden has been advised to work up to 150 minutes of moderate intensity aerobic activity a week and strengthening exercises 2-3 times per week for cardiovascular health, weight loss maintenance and preservation of muscle mass.   She has agreed to Continue current level of physical activity  and Think about ways to increase daily physical activity and overcoming barriers to exercise   Pharmacotherapy We discussed various medication options to help Tabitha Golden with her weight loss efforts and we both agreed to continue Zoloft for emotional eating/cravings.    Return in about 3 weeks (around 01/16/2023).Marland Kitchen She was informed of the importance of frequent follow up visits to maximize her success with intensive lifestyle modifications for her multiple health conditions.  PHYSICAL EXAM:  Blood pressure 117/87, pulse 87, temperature 98.1 F (36.7 C), height 5\' 8"  (1.727 m), weight (!) 345 lb (156.5 kg), SpO2 94 %. Body mass index is 52.46 kg/m.  General: She is overweight, cooperative, alert, well developed, and in no acute distress. PSYCH: Has normal mood, affect and thought process.   Cardiovascular: HR 80's regular, BP 117/87 Lungs: Normal breathing effort, no conversational dyspnea. Neuro: no focal deficits  DIAGNOSTIC DATA REVIEWED:  BMET  Component Value Date/Time   NA 140 10/03/2022 0957   K 3.9 10/03/2022 0957   CL 101 10/03/2022 0957   CO2 21 10/03/2022 0957   GLUCOSE 92 10/03/2022 0957   GLUCOSE 147 (H) 03/20/2020 1628   BUN 12 10/03/2022 0957   CREATININE 0.75  10/03/2022 0957   CALCIUM 8.6 (L) 10/03/2022 0957   GFRNONAA >60 03/20/2020 1628   GFRAA >60 03/20/2020 1628   Lab Results  Component Value Date   HGBA1C 6.2 (H) 10/03/2022   Lab Results  Component Value Date   INSULIN 68.5 (H) 10/03/2022   Lab Results  Component Value Date   TSH 2.920 10/03/2022   CBC    Component Value Date/Time   WBC 7.5 10/03/2022 0957   WBC 8.3 03/20/2020 1628   RBC 5.08 10/03/2022 0957   RBC 4.98 03/20/2020 1628   HGB 12.4 10/03/2022 0957   HCT 38.7 10/03/2022 0957   PLT 350 10/03/2022 0957   MCV 76 (L) 10/03/2022 0957   MCH 24.4 (L) 10/03/2022 0957   MCH 23.7 (L) 03/20/2020 1628   MCHC 32.0 10/03/2022 0957   MCHC 32.9 03/20/2020 1628   RDW 14.8 10/03/2022 0957   Iron Studies    Component Value Date/Time   IRON 31 10/03/2022 0957   TIBC 393 10/03/2022 0957   FERRITIN 21 10/03/2022 0957   IRONPCTSAT 8 (LL) 10/03/2022 0957   Lipid Panel     Component Value Date/Time   CHOL 152 10/03/2022 0957   TRIG 116 10/03/2022 0957   HDL 34 (L) 10/03/2022 0957   LDLCALC 97 10/03/2022 0957   Hepatic Function Panel     Component Value Date/Time   PROT 6.7 10/03/2022 0957   ALBUMIN 4.0 10/03/2022 0957   AST 20 10/03/2022 0957   ALT 23 10/03/2022 0957   ALKPHOS 95 10/03/2022 0957   BILITOT 0.3 10/03/2022 0957      Component Value Date/Time   TSH 2.920 10/03/2022 0957   Nutritional Lab Results  Component Value Date   VD25OH 19.0 (L) 10/03/2022    ASSOCIATED CONDITIONS ADDRESSED TODAY  ASSESSMENT AND PLAN  Problem List Items Addressed This Visit     Vitamin D deficiency - Primary   Relevant Medications   Vitamin D, Ergocalciferol, (DRISDOL) 1.25 MG (50000 UNIT) CAPS capsule   Absolute anemia   Essential hypertension   Relevant Medications   losartan-hydrochlorothiazide (HYZAAR) 100-12.5 MG tablet   Depression, recurrent (HCC)   Relevant Medications   sertraline (ZOLOFT) 50 MG tablet   Obesity with starting BMI of 55.8   BMI  50.0-59.9, adult (HCC) Current BMI 52.6   Vitamin D Deficiency Vitamin D is not at goal of 50.  Most recent vitamin D level was 19.0. She is on  prescription ergocalciferol 50,000 IU weekly. Lab Results  Component Value Date   VD25OH 19.0 (L) 10/03/2022    Plan: Continue and refill  prescription ergocalciferol 50,000 IU weeklyRecheck Vitamin D level 3-4 times yearly to optimize supplementation.  Low vitamin D levels can be associated with adiposity and may result in leptin resistance and weight gain. Also associated with fatigue. Currently on vitamin D supplementation without any adverse effects.    Iron deficiency anemia Anemia is stable. Iron supplementation: OTC iron supplement Lab Results  Component Value Date   WBC 7.5 10/03/2022   HGB 12.4 10/03/2022   HCT 38.7 10/03/2022   MCV 76 (L) 10/03/2022   PLT 350 10/03/2022   Lab Results  Component Value Date   IRON  31 10/03/2022   TIBC 393 10/03/2022   FERRITIN 21 10/03/2022     Plan: Continue supplementation at current dose OTC iron supplementation. Will plan to recheck labs over the next 3-4 months   Hypertension Hypertension improved, asymptomatic, reasonably well controlled, and no significant medication side effects noted.  Medication(s): losartan- hydrochlorothiazide 100-12.5 mg daily.  Renal function is normal.   BP Readings from Last 3 Encounters:  12/26/22 117/87  12/12/22 134/84  11/27/22 (!) 143/87   Lab Results  Component Value Date   CREATININE 0.75 10/03/2022   CREATININE 0.81 03/20/2020   CREATININE 0.76 12/06/2015   No results found for: "GFR"  Plan: Continue  and refill losartan- hydrochlorothiazide 100-12.5 mg daily.  Continue to work on nutrition plan to promote weight loss and improve BP control.     Other depression/emotional eating Derricka has had issues with stress/emotional eating. Currently this is moderately controlled. Overall mood is stable. Medication(s): Other: Zoloft 50 mg  daily. No side effects with Zoloft  Plan: Continue and refill Other: Zoloft 50 mg daily.  Continue to work on emotional eating strategies.     ATTESTASTION STATEMENTS:  Reviewed by clinician on day of visit: allergies, medications, problem list, medical history, surgical history, family history, social history, and previous encounter notes.   I have personally spent 35 minutes total time today in preparation, patient care, nutritional counseling and documentation for this visit, including the following: review of clinical lab tests; review of medical tests/procedures/services.      Viona Hosking, PA-C

## 2022-12-31 ENCOUNTER — Telehealth (INDEPENDENT_AMBULATORY_CARE_PROVIDER_SITE_OTHER): Payer: No Typology Code available for payment source | Admitting: Psychology

## 2022-12-31 DIAGNOSIS — F5089 Other specified eating disorder: Secondary | ICD-10-CM | POA: Diagnosis not present

## 2022-12-31 DIAGNOSIS — F419 Anxiety disorder, unspecified: Secondary | ICD-10-CM | POA: Diagnosis not present

## 2022-12-31 NOTE — Progress Notes (Signed)
Office: [phone]  /  Fax: [phone]    Date: December 31, 2022    Appointment Start Time: 12:01pm Duration: 44 minutes Provider: Lawerance Cruel, Psy.D. Type of Session: Intake for Individual Therapy  Location of Patient: Home (private location) Location of Provider: Provider's home (private office) Type of Contact: Telepsychological Visit via MyChart Video Visit  Informed Consent: Prior to proceeding with today's appointment, two pieces of identifying information were obtained. In addition, Tabitha Golden's physical location at the time of this appointment was obtained as well a phone number she could be reached at in the event of technical difficulties. Tabitha Golden and this provider participated in today's telepsychological service.   The provider's role was explained to Tabitha Golden. The provider reviewed and discussed issues of confidentiality, privacy, and limits therein (e.g., reporting obligations). In addition to verbal informed consent, written informed consent for psychological services was obtained prior to the initial appointment. Since the clinic is not a 24/7 crisis center, mental health emergency resources were shared and this  provider explained MyChart, e-mail, voicemail, and/or other messaging systems should be utilized only for non-emergency reasons. This provider also explained that information obtained during appointments will be placed in [facility] medical record and relevant information will be shared with other providers at [facility] at any locations for coordination of care. Tabitha Golden agreed information may be shared with other [facility] providers as needed for coordination of care and by signing the service agreement document, she provided written consent for coordination of care. Prior to initiating telepsychological services, Tabitha Golden completed an informed consent document, which included the development of a safety plan (i.e., an emergency contact and emergency  resources) in the event of an emergency/crisis. Tabitha Golden verbally acknowledged understanding she is ultimately responsible for understanding her insurance benefits for telepsychological and in-person services. This provider also reviewed confidentiality, as it relates to telepsychological services. Tabitha Golden  acknowledged understanding that appointments cannot be recorded without both party consent and she is aware she is responsible for securing confidentiality on her end of the session. Tabitha Golden verbally consented to proceed.  Chief Complaint/HPI: Tabitha Golden was referred by Tabitha Arms, PA-C due to  Other depression/emotional eating . Per the note for the visit with Tabitha Rayburn, PA-C on 11/27/2022, "Tabitha Golden has had issues with stress/emotional eating and depression which had responded well to sertraline in the past. Currently this is moderately controlled. Overall mood is stable.No SI/HI. Medication(s): Other: Zoloft 50 mg daily. No side effects with Zoloft and feels it is helping stabilize mood and helpful with emotional eating behaviors. She is also open to counseling."   During today's appointment, Tabitha Golden was verbally administered a questionnaire assessing various behaviors related to emotional eating behaviors. [name] endorsed the following: overeat when you are celebrating, experience food cravings on a regular basis, eat certain foods when you are anxious, stressed, depressed, or your feelings are hurt, use food to help you cope with emotional situations, find food is comforting to you, overeat when you are angry or upset, overeat when you are worried about something, and eat as a reward. She shared she craves sweets (e.g., chocolate, pastries, ice cream, and cereal). [name] believes the onset of emotional eating behaviors was likely in childhood after her maternal grandfather's passing (age 66). She described the current frequency of emotional eating behaviors as "couple times a week." In addition, [name] endorsed a  history of binge eating behaviors (e.g., mindlessly consuming a whole carton of ice cream while watching TV), noting the last time was  likely a year ago and described the previous frequency as situational. [name] denied a history of significantly restricting food intake, purging and engagement in other compensatory strategies for weight loss, and has never been diagnosed with an eating disorder. She also denied a history of treatment for emotional or binge eating behaviors. Currently, [name] indicated she is prescribed a structured meal plan (Cat 4), noting, "This is the easiest program I've done." She discussed she often experiences cravings when mensurating. Furthermore, Tabitha Golden denied other problems of concern.    Mental Status Examination:  Appearance: neat Behavior: appropriate to circumstances Mood: neutral Affect: mood congruent Speech: WNL Eye Contact: appropriate Psychomotor Activity: WNL Gait: unable to assess  Thought Process: linear, logical, and goal directed and denies suicidal, homicidal, and self-harm ideation, plan and intent  Thought Content/Perception: no hallucinations, delusions, bizarre thinking or behavior endorsed or observed Orientation: AAOx4 Memory/Concentration: intact Insight/Judgment: fair  Family & Psychosocial History: Tabitha Golden reported she is married, and she has two children (ages 69 and 4). 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 Additionally, Tabitha Golden shared her highest level of education obtained is a bachelor's degree. Currently, [name]'s social support system consists of her husband, mom, dad, and children. Moreover, Tabitha Golden stated she resides with her husband and children.   Medical History:  Past Medical History:  Diagnosis Date   Hypertension    Joint pain    Obesity    PCOS (polycystic ovarian syndrome)    Prediabetes    Past Surgical History:  Procedure Laterality Date   APPENDECTOMY     CESAREAN SECTION     KNEE SURGERY      WISDOM TOOTH EXTRACTION     Current Outpatient Medications on File Prior to Visit  Medication Sig Dispense Refill   calcium carbonate (TUMS - DOSED IN MG ELEMENTAL CALCIUM) 500 MG chewable tablet Chew 1 tablet by mouth daily.     diphenhydrAMINE HCl (ALLERGY MED PO) Take by mouth.     Iron-FA-B Cmp-C-Biot-Probiotic (FUSION PLUS) CAPS Take 1 capsule by mouth daily. 90 capsule 0   losartan-hydrochlorothiazide (HYZAAR) 100-12.5 MG tablet Take 1 tablet by mouth daily. 30 tablet 0   sertraline (ZOLOFT) 50 MG tablet Take 1 tablet (50 mg total) by mouth daily. 90 tablet 0   Vitamin D, Ergocalciferol, (DRISDOL) 1.25 MG (50000 UNIT) CAPS capsule Take 1 capsule (50,000 Units total) by mouth every 7 (seven) days. 4 capsule 0   No current facility-administered medications on file prior to visit.  Vaidehi stated she is medication compliant.   Mental Health History: Tabitha Golden reported she has never attended therapeutic services. She noted she is currently prescribed Zoloft by Tabitha Golden and described it as helpful. Lachele reported there is no history of hospitalizations for psychiatric concerns. Katey denied a family history of mental health/substance abuse related concerns. Furthermore, Tabitha Golden reported there is no history of trauma including psychological, physical , and sexual abuse, as well as neglect.   Rieley described her typical mood lately as "good." She explained prior to taking Zoloft she felt anxious and overwhelmed. She also disclosed a history of "situational" panic attacks, noting the last one approximately three years ago. Elanor denied current alcohol use. She denied tobacco use. She denied illicit/recreational substance use. Furthermore, Tabitha Golden indicated she is not experiencing the following: hallucinations and delusions, paranoia, symptoms of mania , social withdrawal, crying spells, memory concerns, attention and concentration issues, and obsessions and compulsions. She also denied history of and current  suicidal ideation, plan, and  intent; history of and current homicidal ideation, plan, and intent; and history of and current engagement in self-harm.  Legal History: Tabitha Golden reported there is no history of legal involvement.   Structured Assessments Results: The Patient Health Questionnaire-9 (PHQ-9) is a self-report measure that assesses symptoms and severity of depression over the course of the last two weeks. Tabitha Golden obtained a score of 1 suggesting minimal depression. Tabitha Golden finds the endorsed symptoms to be not difficult at all. [0= Not at all; 1= Several days; 2= More than half the days; 3= Nearly every day] Little interest or pleasure in doing things 0  Feeling down, depressed, or hopeless 0  Trouble falling or staying asleep, or sleeping too much 0  Feeling tired or having little energy 1  Poor appetite or overeating 0  Feeling bad about yourself --- or that you are a failure or have let yourself or your family down 0  Trouble concentrating on things, such as reading the newspaper or watching television 0  Moving or speaking so slowly that other people could have noticed? Or the opposite --- being so fidgety or restless that you have been moving around a lot more than usual 0  Thoughts that you would be better off dead or hurting yourself in some way 0  PHQ-9 Score 1    The Generalized Anxiety Disorder-7 (GAD-7) is a brief self-report measure that assesses symptoms of anxiety over the course of the last two weeks. Tabitha Golden obtained a score of 0. [0= Not at all; 1= Several days; 2= Over half the days; 3= Nearly every day] Feeling nervous, anxious, on edge 0  Not being able to stop or control worrying 0  Worrying too much about different things 0  Trouble relaxing 0  Being so restless that it's hard to sit still 0  Becoming easily annoyed or irritable 0  Feeling afraid as if something awful might happen 0  GAD-7 Score 0   Interventions:  Focused on rapport building Verbally administered  PHQ-9 and GAD-7 for symptom monitoring Verbally administered Food & Mood questionnaire to assess various behaviors related to emotional eating Provided emphatic reflections and validation Psychoeducation provided regarding physical versus emotional hunger  Diagnostic Impressions & Provisional DSM-5 Diagnosis(es): Tabitha Golden noted a history of engagement in emotional eating behaviors, noting the onset was likely in childhood after her maternal grandfather's passing (age 25). She described the current frequency of emotional eating behaviors as "couple times a week." She also noted a history of engagement in binge eating behaviors, but denied current engagement. She denied engagement in other disordered eating behaviors.  Additionally, Tabitha Golden reported prior to starting Zoloft, she would feel anxious and overwhelmed. Based on the aforementioned, the following diagnoses were assigned: F50.89 Other Specified Feeding or Eating Disorder, Emotional Eating Behaviors and F41.9 Unspecified Anxiety Disorder.  Plan: Tabitha Golden appears able and willing to participate as evidenced by engagement in reciprocal conversation and asking questions as needed for clarification. The next appointment is scheduled for 01/29/2023 at 2pm, which will be via MyChart Video Visit. The following treatment goal was established: increase coping skills. This provider will regularly review the treatment plan and medical chart to keep informed of status changes. Tabitha Golden expressed understanding and agreement with the initial treatment plan of care. Laurin will be sent a handout via e-mail to utilize between now and the next appointment to increase awareness of hunger patterns and subsequent eating. Tabitha Golden provided verbal consent during today's appointment for this provider to send the handout via e-mail.

## 2023-01-09 ENCOUNTER — Encounter (INDEPENDENT_AMBULATORY_CARE_PROVIDER_SITE_OTHER): Payer: Self-pay | Admitting: Family Medicine

## 2023-01-09 ENCOUNTER — Ambulatory Visit (INDEPENDENT_AMBULATORY_CARE_PROVIDER_SITE_OTHER): Payer: No Typology Code available for payment source | Admitting: Family Medicine

## 2023-01-09 VITALS — BP 173/103 | HR 69 | Temp 97.7°F | Ht 68.0 in | Wt 344.0 lb

## 2023-01-09 DIAGNOSIS — E669 Obesity, unspecified: Secondary | ICD-10-CM | POA: Diagnosis not present

## 2023-01-09 DIAGNOSIS — I10 Essential (primary) hypertension: Secondary | ICD-10-CM

## 2023-01-09 DIAGNOSIS — Z6841 Body Mass Index (BMI) 40.0 and over, adult: Secondary | ICD-10-CM

## 2023-01-09 DIAGNOSIS — E559 Vitamin D deficiency, unspecified: Secondary | ICD-10-CM

## 2023-01-09 MED ORDER — VITAMIN D (ERGOCALCIFEROL) 1.25 MG (50000 UNIT) PO CAPS: 50000.0000 [IU] | ORAL_CAPSULE | ORAL | 0 refills | Status: DC

## 2023-01-09 NOTE — Progress Notes (Unsigned)
Chief Complaint:   OBESITY Tabitha Golden is here to discuss her progress with her obesity treatment plan along with follow-up of her obesity related diagnoses. Tabitha Golden is on the Category 4 Plan and states she is following her eating plan approximately 90% of the time. Tabitha Golden states she is doing 0 minutes 0 times per week.  Today's visit was #: 7 Starting weight: 366 lbs Starting date: 10/03/2022 Today's weight: 344 lbs Today's date: 01/09/2023 Total lbs lost to date: 22 Total lbs lost since last in-office visit: 1  Interim History: Patient continues to work on her diet.  She has had extra challenges with changes in her work schedule and kids sports routine.  She is working on meal planning and overall she is happy with her eating plan.  She is looking at recipes online.  Her exercise has fallen off.  Subjective:   1. Essential hypertension Patient's blood pressure is elevated even after her second blood pressure check.  She just took her morning losartan-hydrochlorothiazide.  2. Vitamin D deficiency Patient is stable on vitamin D prescription.  Assessment/Plan:   1. Essential hypertension Patient was encouraged to take her medications at a regular time.  We will recheck her blood pressure at her next visit in 2 weeks.  2. Vitamin D deficiency Patient will continue prescription vitamin D once weekly, and we will refill for 1 month.  - Vitamin D, Ergocalciferol, (DRISDOL) 1.25 MG (50000 UNIT) CAPS capsule; Take 1 capsule (50,000 Units total) by mouth every 7 (seven) days.  Dispense: 4 capsule; Refill: 0  3. BMI 50.0-59.9, adult (HCC) Current BMI 52.6  4. Obesity, Beginning BMI of 55.8 Tabitha Golden is currently in the action stage of change. As such, her goal is to continue with weight loss efforts. She has agreed to the Category 4 Plan.   Behavioral modification strategies: increasing lean protein intake.  Tabitha Golden has agreed to follow-up with our clinic in 2 weeks. She was informed of the  importance of frequent follow-up visits to maximize her success with intensive lifestyle modifications for her multiple health conditions.   Objective:   Blood pressure (!) 173/103, pulse 69, temperature 97.7 F (36.5 C), height 5\' 8"  (1.727 m), weight (!) 344 lb (156 kg), SpO2 98%. Body mass index is 52.31 kg/m.  Lab Results  Component Value Date   CREATININE 0.75 10/03/2022   BUN 12 10/03/2022   NA 140 10/03/2022   K 3.9 10/03/2022   CL 101 10/03/2022   CO2 21 10/03/2022   Lab Results  Component Value Date   ALT 23 10/03/2022   AST 20 10/03/2022   ALKPHOS 95 10/03/2022   BILITOT 0.3 10/03/2022   Lab Results  Component Value Date   HGBA1C 6.2 (H) 10/03/2022   Lab Results  Component Value Date   INSULIN 68.5 (H) 10/03/2022   Lab Results  Component Value Date   TSH 2.920 10/03/2022   Lab Results  Component Value Date   CHOL 152 10/03/2022   HDL 34 (L) 10/03/2022   LDLCALC 97 10/03/2022   TRIG 116 10/03/2022   Lab Results  Component Value Date   VD25OH 19.0 (L) 10/03/2022   Lab Results  Component Value Date   WBC 7.5 10/03/2022   HGB 12.4 10/03/2022   HCT 38.7 10/03/2022   MCV 76 (L) 10/03/2022   PLT 350 10/03/2022   Lab Results  Component Value Date   IRON 31 10/03/2022   TIBC 393 10/03/2022   FERRITIN 21 10/03/2022  Attestation Statements:   Reviewed by clinician on day of visit: allergies, medications, problem list, medical history, surgical history, family history, social history, and previous encounter notes.  I have personally spent 40 minutes total time today in preparation, patient care, and documentation for this visit, including the following: review of clinical lab tests; review of medical tests/procedures/services.  I, Burt Knack, am acting as transcriptionist for Quillian Quince, MD.  I have reviewed the above documentation for accuracy and completeness, and I agree with the above. -  Quillian Quince, MD

## 2023-01-14 ENCOUNTER — Encounter: Payer: Self-pay | Admitting: Family Medicine

## 2023-01-14 ENCOUNTER — Ambulatory Visit (INDEPENDENT_AMBULATORY_CARE_PROVIDER_SITE_OTHER): Payer: No Typology Code available for payment source | Admitting: Family Medicine

## 2023-01-14 VITALS — BP 138/82 | HR 74 | Temp 97.7°F | Resp 16 | Ht 68.0 in | Wt 348.1 lb

## 2023-01-14 DIAGNOSIS — F39 Unspecified mood [affective] disorder: Secondary | ICD-10-CM

## 2023-01-14 DIAGNOSIS — Z6841 Body Mass Index (BMI) 40.0 and over, adult: Secondary | ICD-10-CM

## 2023-01-14 DIAGNOSIS — I10 Essential (primary) hypertension: Secondary | ICD-10-CM

## 2023-01-14 NOTE — Progress Notes (Signed)
SUBJECTIVE:   Chief Complaint  Patient presents with   Establish Care   HPI Patient presents to clinic to establish care.  No acute concerns.  Weight management [facility] in [place].  Has been doing well on current diet.  Starting weight 366 pounds.  Today's weight 348 pounds.  Recent labs completed.  Mood disorder Recently started Zoloft 50 mg daily.  Doing well.  Denies SI/HI.  Was prescribed by weight management provider to help improve mood and move forward with weight loss.  Hypertension Asymptomatic.  Currently on Hyzaar 100-12.5 mg daily.  Tolerating medication well.  Denies any headaches, visual changes, chest pain, heart palpitations or lower extremity edema.  Last Pap 2023.  Follows with [facility].  PERTINENT PMH / PSH: Hypertension Mood disorder PCOS  Knee surgery Appendectomy History of gastric lap band and removal  Denies tobacco use, EtOH social No vaping/THC/recreational drugs  OBJECTIVE:  BP 138/82   Pulse 74   Temp 97.7 F (36.5 C)   Resp 16   Ht 5\' 8"  (1.727 m)   Wt (!) 348 lb 2 oz (157.9 kg)   LMP 01/07/2023   SpO2 97%   BMI 52.93 kg/m    Physical Exam Vitals reviewed.  Constitutional:      General: She is not in acute distress.    Appearance: She is obese. She is not ill-appearing.  HENT:     Head: Normocephalic.     Right Ear: Tympanic membrane, ear canal and external ear normal. There is no impacted cerumen.     Left Ear: Tympanic membrane, ear canal and external ear normal. There is no impacted cerumen.     Nose: Nose normal.     Mouth/Throat:     Mouth: Mucous membranes are moist.  Eyes:     Extraocular Movements: Extraocular movements intact.     Conjunctiva/sclera: Conjunctivae normal.     Pupils: Pupils are equal, round, and reactive to light.  Neck:     Thyroid: No thyromegaly or thyroid tenderness.     Vascular: No carotid bruit.  Cardiovascular:     Rate and Rhythm:  Normal rate and regular rhythm.     Pulses: Normal pulses.     Heart sounds: Normal heart sounds.  Pulmonary:     Effort: Pulmonary effort is normal.     Breath sounds: Normal breath sounds.  Abdominal:     General: Bowel sounds are normal. There is no distension.     Palpations: Abdomen is soft.     Tenderness: There is no abdominal tenderness. There is no right CVA tenderness, left CVA tenderness, guarding or rebound.  Musculoskeletal:        General: Normal range of motion.     Cervical back: Normal range of motion.     Right lower leg: No edema.     Left lower leg: No edema.  Lymphadenopathy:     Cervical: No cervical adenopathy.  Skin:    General: Skin is warm.     Capillary Refill: Capillary refill takes less than 2 seconds.  Neurological:     General: No focal deficit present.     Mental Status: She is alert and oriented to person, place, and time. Mental status is at baseline.     Motor: No weakness.  Psychiatric:        Mood and Affect: Mood normal.        Behavior: Behavior normal.  Thought Content: Thought content normal.        Judgment: Judgment normal.        01/14/2023    2:54 PM  Depression screen PHQ 2/9  Decreased Interest 0  Down, Depressed, Hopeless 0  PHQ - 2 Score 0  Altered sleeping 0  Tired, decreased energy 1  Change in appetite 0  Feeling bad or failure about yourself  0  Trouble concentrating 0  Moving slowly or fidgety/restless 0  Suicidal thoughts 0  PHQ-9 Score 1  Difficult doing work/chores Not difficult at all      01/14/2023    2:54 PM  GAD 7 : Generalized Anxiety Score  Nervous, Anxious, on Edge 0  Control/stop worrying 0  Worry too much - different things 0  Trouble relaxing 0  Restless 0  Easily annoyed or irritable 0  Afraid - awful might happen 0  Total GAD 7 Score 0  Anxiety Difficulty Not difficult at all    ASSESSMENT/PLAN:  BMI 50.0-59.9, adult (HCC) Current BMI 52.6 Assessment & Plan: Elevated  BMI Follows with [facility] Encouraged to continue with current meal plan and follow-up as scheduled   Mood disorder [facility]) Assessment & Plan: Chronic.  Denies SI/HI. Currently taking Zoloft 50 mg daily and doing well. Plans to continue to follow with [facility] for management  Will follow peripherally    Essential hypertension Assessment & Plan: Chronic.  Well-controlled on current medication. Continue Hyzaar 100-12.5 mg daily Recent creatinine within normal limits. Continue to monitor BP is weight decreases, may need adjustments at that time. Follow-up as needed    PDMP reviewed  Return in about 6 months (around 07/17/2023).  Dana Allan, MD

## 2023-01-14 NOTE — Patient Instructions (Signed)
It was a pleasure meeting you today. Thank you for allowing me to take part in your health care.  Our goals for today as we discussed include:  Continue to follow up with [facility] Congratulations on your success  For your blood pressure Continue Hyzaar 100-12.5 mg daily Monitor BP at home.  If remains >140/90 would recommend additional medication.   Follow up in 6 months if BP remains elevated  Recommend trial of Flonase 2 sprays daily Continue Zyrtec 10 mg daily  If you have any questions or concerns, please do not hesitate to call the office at [phone].  I look forward to our next visit and until then take care and stay safe.  Regards,   Dana Allan, MD   [facility]

## 2023-01-23 ENCOUNTER — Encounter (INDEPENDENT_AMBULATORY_CARE_PROVIDER_SITE_OTHER): Payer: Self-pay | Admitting: Family Medicine

## 2023-01-23 ENCOUNTER — Ambulatory Visit (INDEPENDENT_AMBULATORY_CARE_PROVIDER_SITE_OTHER): Payer: No Typology Code available for payment source | Admitting: Family Medicine

## 2023-01-23 VITALS — BP 146/90 | HR 67 | Temp 97.7°F | Ht 68.0 in | Wt 344.0 lb

## 2023-01-23 DIAGNOSIS — I10 Essential (primary) hypertension: Secondary | ICD-10-CM

## 2023-01-23 DIAGNOSIS — E669 Obesity, unspecified: Secondary | ICD-10-CM

## 2023-01-23 DIAGNOSIS — E559 Vitamin D deficiency, unspecified: Secondary | ICD-10-CM | POA: Diagnosis not present

## 2023-01-23 DIAGNOSIS — Z6841 Body Mass Index (BMI) 40.0 and over, adult: Secondary | ICD-10-CM

## 2023-01-23 MED ORDER — VITAMIN D (ERGOCALCIFEROL) 1.25 MG (50000 UNIT) PO CAPS
50000.0000 [IU] | ORAL_CAPSULE | ORAL | 0 refills | Status: DC
Start: 2023-01-23 — End: 2023-02-06

## 2023-01-23 NOTE — Progress Notes (Signed)
Chief Complaint:   OBESITY Tabitha Golden is here to discuss her progress with her obesity treatment plan along with follow-up of her obesity related diagnoses. Mariadelcarmen is on the Category 4 Plan and states she is following her eating plan approximately 90% of the time. Skylah states she is doing 0 minutes 0 times per week.  Today's visit was #: 8 Starting weight: 366 lbs Starting date: 10/03/2022 Today's weight: 344 lbs Today's date: 01/23/2023 Total lbs lost to date: 22 Total lbs lost since last in-office visit: 0  Interim History: Patient has been very busy driving her son to football 3x a week and she is really ready for school to start.  Last two visits she saw Dr. Dalbert Garnet and Ines Bloomer.  She is been opting for grab and go options more in the last few weeks.  She is is choosing options that she knows she can get the nutrition in. A few nights she has fallen asleep prior to eating dinner. She is thinking it will be easier to get back on meal plan when school restarts.  Thinking she and her family may go to the beach for Labor Day.  Goal is in the next 3 days to get food into the house and have a plan for the meals she will make for her family.  Subjective:   1. Vitamin D deficiency Patient denies nausea, vomiting, or muscle weakness but notes fatigue.  She is on prescription vitamin D.  2. Essential hypertension Patient's blood pressure is elevated today.  She denies chest pain, chest pressure, or headache.  She is on Hyzaar 100-12.5 mg.  Assessment/Plan:   1. Vitamin D deficiency Patient will continue prescription vitamin D once weekly, we will refill for 1 month.  - Vitamin D, Ergocalciferol, (DRISDOL) 1.25 MG (50000 UNIT) CAPS capsule; Take 1 capsule (50,000 Units total) by mouth every 7 (seven) days.  Dispense: 4 capsule; Refill: 0  2. Essential hypertension Patient will continue Hyzaar; her blood pressure has been labile so we will increase her medications at her next appointment if her  blood pressure has not improved.  3. Obesity, Beginning BMI of 55.8  4. BMI 50.0-59.9, adult (HCC) Current BMI 52.6 Elizah is currently in the action stage of change. As such, her goal is to continue with weight loss efforts. She has agreed to the Category 4 Plan.   Exercise goals: All adults should avoid inactivity. Some physical activity is better than none, and adults who participate in any amount of physical activity gain some health benefits.  Behavioral modification strategies: increasing lean protein intake, meal planning and cooking strategies, keeping healthy foods in the home, and planning for success.  Dinisha has agreed to follow-up with our clinic in 3 weeks. She was informed of the importance of frequent follow-up visits to maximize her success with intensive lifestyle modifications for her multiple health conditions.   Objective:   Blood pressure (!) 146/90, pulse 67, temperature 97.7 F (36.5 C), height 5\' 8"  (1.727 m), weight (!) 344 lb (156 kg), last menstrual period 01/07/2023, SpO2 98%. Body mass index is 52.31 kg/m.  General: Cooperative, alert, well developed, in no acute distress. HEENT: Conjunctivae and lids unremarkable. Cardiovascular: Regular rhythm.  Lungs: Normal work of breathing. Neurologic: No focal deficits.   Lab Results  Component Value Date   CREATININE 0.75 10/03/2022   BUN 12 10/03/2022   NA 140 10/03/2022   K 3.9 10/03/2022   CL 101 10/03/2022   CO2 21 10/03/2022  Lab Results  Component Value Date   ALT 23 10/03/2022   AST 20 10/03/2022   ALKPHOS 95 10/03/2022   BILITOT 0.3 10/03/2022   Lab Results  Component Value Date   HGBA1C 6.2 (H) 10/03/2022   Lab Results  Component Value Date   INSULIN 68.5 (H) 10/03/2022   Lab Results  Component Value Date   TSH 2.920 10/03/2022   Lab Results  Component Value Date   CHOL 152 10/03/2022   HDL 34 (L) 10/03/2022   LDLCALC 97 10/03/2022   TRIG 116 10/03/2022   Lab Results   Component Value Date   VD25OH 19.0 (L) 10/03/2022   Lab Results  Component Value Date   WBC 7.5 10/03/2022   HGB 12.4 10/03/2022   HCT 38.7 10/03/2022   MCV 76 (L) 10/03/2022   PLT 350 10/03/2022   Lab Results  Component Value Date   IRON 31 10/03/2022   TIBC 393 10/03/2022   FERRITIN 21 10/03/2022   Attestation Statements:   Reviewed by clinician on day of visit: allergies, medications, problem list, medical history, surgical history, family history, social history, and previous encounter notes.   I, Burt Knack, am acting as transcriptionist for Reuben Likes, MD.  I have reviewed the above documentation for accuracy and completeness, and I agree with the above. - Reuben Likes, MD

## 2023-01-27 ENCOUNTER — Encounter: Payer: Self-pay | Admitting: Family Medicine

## 2023-01-27 NOTE — Assessment & Plan Note (Signed)
Chronic.  Well-controlled on current medication. Continue Hyzaar 100-12.5 mg daily Recent creatinine within normal limits. Continue to monitor BP is weight decreases, may need adjustments at that time. Follow-up as needed

## 2023-01-27 NOTE — Assessment & Plan Note (Signed)
Chronic.  Denies SI/HI. Currently taking Zoloft 50 mg daily and doing well. Plans to continue to follow with [facility] for management  Will follow peripherally

## 2023-01-27 NOTE — Assessment & Plan Note (Signed)
Elevated BMI Follows with [facility] Encouraged to continue with current meal plan and follow-up as scheduled

## 2023-01-29 ENCOUNTER — Telehealth (INDEPENDENT_AMBULATORY_CARE_PROVIDER_SITE_OTHER): Payer: No Typology Code available for payment source | Admitting: Psychology

## 2023-01-29 DIAGNOSIS — F5089 Other specified eating disorder: Secondary | ICD-10-CM | POA: Diagnosis not present

## 2023-01-29 NOTE — Progress Notes (Signed)
  Office: [phone]  /  Fax: [phone]    Date: January 29, 2023  Appointment Start Time: 2:02pm Duration: 28 minutes Provider: Lawerance Cruel, Psy.D. Type of Session: Individual Therapy  Location of Patient: Work (private location) Location of Provider: Provider's Home (private office) Type of Contact: Telepsychological Visit via MyChart Video Visit  Session Content: Tabitha Golden is a 37 y.o. female presenting for a follow-up appointment to address the previously established treatment goal of increasing coping skills.Today's appointment was a telepsychological visit. [name] provided verbal consent for today's telepsychological appointment and she is aware she is responsible for securing confidentiality on her end of the session. Prior to proceeding with today's appointment, [name]'s physical location at the time of this appointment was obtained as well a phone number she could be reached at in the event of technical difficulties. [name] and this provider participated in today's telepsychological service.   This provider conducted a brief check-in. [name] shared, "Things have been going well," but discussed being "super busy." She acknowledged challenges with her eating habits due to ongoing stressors. Further explored. She was engaged in problem solving to help her eat more congruent to her structured meal plan. She agreed to implement the following: double recipes; use frozen vegetables to cook in microwave; ask Dr. 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 She was encouraged to write the aforementioned down for future reference. Overall, [name] was receptive to today's appointment as evidenced by openness to sharing, responsiveness to feedback, and willingness to implement discussed strategies .  Mental Status Examination:  Appearance: neat Behavior: appropriate to circumstances Mood:  neutral Affect: mood congruent Speech: WNL Eye Contact: appropriate Psychomotor Activity: WNL Gait: unable to assess Thought Process: linear, logical, and goal directed and no evidence or endorsement of suicidal, homicidal, and self-harm ideation, plan and intent  Thought Content/Perception: no hallucinations, delusions, bizarre thinking or behavior endorsed or observed Orientation: AAOx4 Memory/Concentration: intact Insight: fair Judgment: fair  Interventions:  Conducted a brief chart review Provided empathic reflections and validation Provided positive reinforcement Employed supportive psychotherapy interventions to facilitate reduced distress and to improve coping skills with identified stressors Engaged patient in problem solving  DSM-5 Diagnosis(es):  F50.89 Other Specified Feeding or Eating Disorder, Emotional Eating Behaviors and F41.9 Unspecified Anxiety Disorder  Treatment Goal & Progress: During the initial appointment with this provider, the following treatment goal was established: increase coping skills. Progress is limited, as [name] has just begun treatment with this provider; however, she is receptive to the interaction and interventions and rapport is being established.   Plan: The next appointment is scheduled for 02/26/2023 at 2pm, which will be via MyChart Video Visit. The next session will focus on working towards the established treatment goal.

## 2023-01-30 ENCOUNTER — Other Ambulatory Visit (INDEPENDENT_AMBULATORY_CARE_PROVIDER_SITE_OTHER): Payer: Self-pay | Admitting: Family Medicine

## 2023-01-30 DIAGNOSIS — F339 Major depressive disorder, recurrent, unspecified: Secondary | ICD-10-CM

## 2023-02-06 ENCOUNTER — Ambulatory Visit (INDEPENDENT_AMBULATORY_CARE_PROVIDER_SITE_OTHER): Payer: No Typology Code available for payment source | Admitting: Family Medicine

## 2023-02-06 ENCOUNTER — Encounter (INDEPENDENT_AMBULATORY_CARE_PROVIDER_SITE_OTHER): Payer: Self-pay | Admitting: Family Medicine

## 2023-02-06 VITALS — BP 142/94 | HR 80 | Temp 97.7°F | Ht 68.0 in | Wt 342.0 lb

## 2023-02-06 DIAGNOSIS — E669 Obesity, unspecified: Secondary | ICD-10-CM

## 2023-02-06 DIAGNOSIS — E559 Vitamin D deficiency, unspecified: Secondary | ICD-10-CM

## 2023-02-06 DIAGNOSIS — Z6841 Body Mass Index (BMI) 40.0 and over, adult: Secondary | ICD-10-CM | POA: Diagnosis not present

## 2023-02-06 DIAGNOSIS — I10 Essential (primary) hypertension: Secondary | ICD-10-CM

## 2023-02-06 MED ORDER — VITAMIN D (ERGOCALCIFEROL) 1.25 MG (50000 UNIT) PO CAPS
50000.0000 [IU] | ORAL_CAPSULE | ORAL | 0 refills | Status: DC
Start: 2023-02-06 — End: 2023-03-06

## 2023-02-06 MED ORDER — LOSARTAN POTASSIUM-HCTZ 100-12.5 MG PO TABS
1.0000 | ORAL_TABLET | Freq: Every day | ORAL | 0 refills | Status: DC
Start: 2023-02-06 — End: 2023-03-06

## 2023-02-06 NOTE — Progress Notes (Signed)
Chief Complaint:   OBESITY Tabitha Golden is here to discuss her progress with her obesity treatment plan along with follow-up of her obesity related diagnoses. Tabitha Golden is on the Category 4 Plan and states she is following her eating plan approximately 90% of the time. Tabitha Golden states she is doing 0 minutes 0 times per week.  Today's visit was #: 9 Starting weight: 366 lbs Starting date: 10/03/2022 Today's weight: 342 lbs Today's date: 02/06/2023 Total lbs lost to date: 24 Total lbs lost since last in-office visit: 2  Interim History: Patient has been mostly following meal plan despite having company for a few weekends.  She is still doing all the activities with her kids and is hoping that life will quiet down when school starts and they all get back into a routine. She has not been hungry.  Her biggest time of craving is when she is having her period.  She has had some cravings but nothing that she couldn't control.  For Labor Day weekend she is anticipating staying at home.   Subjective:   1. Vitamin D deficiency Patient's last vitamin D level was of 19 in April, and she notes fatigue.  2. Essential hypertension Patient's blood pressure is slightly elevated today.  She denies chest pain, chest pressure, or headache.  She is on losartan-hydrochlorothiazide.  Assessment/Plan:   1. Vitamin D deficiency Patient will continue prescription vitamin D 50,000 IU once weekly, and we will refill for 1 month.  - Vitamin D, Ergocalciferol, (DRISDOL) 1.25 MG (50000 UNIT) CAPS capsule; Take 1 capsule (50,000 Units total) by mouth every 7 (seven) days.  Dispense: 4 capsule; Refill: 0  2. Essential hypertension Patient will continue Hyzaar 100-12.5 mg once daily, and we will refill for 1 month.  - losartan-hydrochlorothiazide (HYZAAR) 100-12.5 MG tablet; Take 1 tablet by mouth daily.  Dispense: 30 tablet; Refill: 0  3. BMI 50.0-59.9, adult (HCC)  4. Obesity, Beginning BMI of 55.8 Tabitha Golden is currently  in the action stage of change. As such, her goal is to continue with weight loss efforts. She has agreed to the Category 4 Plan.   Exercise goals: All adults should avoid inactivity. Some physical activity is better than none, and adults who participate in any amount of physical activity gain some health benefits.  Patient is to start 15 minutes 3-4 times per week of some activity.  Behavioral modification strategies: increasing lean protein intake, meal planning and cooking strategies, keeping healthy foods in the home, and planning for success.  Briannie has agreed to follow-up with our clinic in 4 weeks. She was informed of the importance of frequent follow-up visits to maximize her success with intensive lifestyle modifications for her multiple health conditions.   Objective:   Blood pressure (!) 142/94, pulse 80, temperature 97.7 F (36.5 C), height 5\' 8"  (1.727 m), weight (!) 342 lb (155.1 kg), last menstrual period 01/07/2023, SpO2 91%. Body mass index is 52 kg/m.  General: Cooperative, alert, well developed, in no acute distress. HEENT: Conjunctivae and lids unremarkable. Cardiovascular: Regular rhythm.  Lungs: Normal work of breathing. Neurologic: No focal deficits.   Lab Results  Component Value Date   CREATININE 0.75 10/03/2022   BUN 12 10/03/2022   NA 140 10/03/2022   K 3.9 10/03/2022   CL 101 10/03/2022   CO2 21 10/03/2022   Lab Results  Component Value Date   ALT 23 10/03/2022   AST 20 10/03/2022   ALKPHOS 95 10/03/2022   BILITOT 0.3 10/03/2022  Lab Results  Component Value Date   HGBA1C 6.2 (H) 10/03/2022   Lab Results  Component Value Date   INSULIN 68.5 (H) 10/03/2022   Lab Results  Component Value Date   TSH 2.920 10/03/2022   Lab Results  Component Value Date   CHOL 152 10/03/2022   HDL 34 (L) 10/03/2022   LDLCALC 97 10/03/2022   TRIG 116 10/03/2022   Lab Results  Component Value Date   VD25OH 19.0 (L) 10/03/2022   Lab Results  Component  Value Date   WBC 7.5 10/03/2022   HGB 12.4 10/03/2022   HCT 38.7 10/03/2022   MCV 76 (L) 10/03/2022   PLT 350 10/03/2022   Lab Results  Component Value Date   IRON 31 10/03/2022   TIBC 393 10/03/2022   FERRITIN 21 10/03/2022   Attestation Statements:   Reviewed by clinician on day of visit: allergies, medications, problem list, medical history, surgical history, family history, social history, and previous encounter notes.   I, Burt Knack, am acting as transcriptionist for Reuben Likes, MD.  I have reviewed the above documentation for accuracy and completeness, and I agree with the above. - Reuben Likes, MD

## 2023-02-26 ENCOUNTER — Telehealth (INDEPENDENT_AMBULATORY_CARE_PROVIDER_SITE_OTHER): Payer: No Typology Code available for payment source | Admitting: Psychology

## 2023-03-05 ENCOUNTER — Telehealth (INDEPENDENT_AMBULATORY_CARE_PROVIDER_SITE_OTHER): Payer: No Typology Code available for payment source | Admitting: Psychology

## 2023-03-05 DIAGNOSIS — F419 Anxiety disorder, unspecified: Secondary | ICD-10-CM | POA: Diagnosis not present

## 2023-03-05 DIAGNOSIS — F5089 Other specified eating disorder: Secondary | ICD-10-CM

## 2023-03-05 NOTE — Progress Notes (Signed)
  Office: [phone]  /  Fax: [phone]    Date: March 05, 2023  Appointment Start Time: 9:31am Duration: 22 minutes Provider: Lawerance Cruel, Psy.D. Type of Session: Individual Therapy  Location of Patient: Work (private location) Location of Provider: Provider's Home (private office) Type of Contact: Telepsychological Visit via MyChart Video Visit  Session Content: Tabitha Golden is a 37 y.o. female presenting for a follow-up appointment to address the previously established treatment goal of increasing coping skills.Today's appointment was a telepsychological visit. [name] provided verbal consent for today's telepsychological appointment and she is aware she is responsible for securing confidentiality on her end of the session. Prior to proceeding with today's appointment, Orpah's physical location at the time of this appointment was obtained as well a phone number she could be reached at in the event of technical difficulties. [name] and this provider participated in today's telepsychological service.   This provider conducted a brief check-in. Bushra reported she is "back on routine" with school starting up again. She indicated implementing discussed strategies, noting a reduction in eating out. Positive reinforcement was provided. She also described an improvement in emotional eating behaviors; however, she continues to express concern regarding emotional eating behaviors. As such, psychoeducation regarding triggers for emotional eating was provided. Ellanora was provided a handout, and encouraged to utilize the handout between now and the next appointment to increase awareness of triggers and frequency. [name] agreed. This provider also discussed behavioral strategies for specific triggers, such as placing the utensil down when conversing to avoid mindless eating. [name] provided verbal consent during today's appointment for this provider to send a handout about triggers via e-mail. Overall, Shaunelle was  receptive to today's appointment as evidenced by openness to sharing, responsiveness to feedback, and willingness to explore triggers for emotional eating.  Mental Status Examination:  Appearance: neat Behavior: appropriate to circumstances Mood: neutral Affect: mood congruent Speech: WNL Eye Contact: appropriate Psychomotor Activity: WNL Gait: unable to assess Thought Process: linear, logical, and goal directed and no evidence or endorsement of suicidal, homicidal, and self-harm ideation, plan and intent  Thought Content/Perception: no hallucinations, delusions, bizarre thinking or behavior endorsed or observed Orientation: AAOx4 Memory/Concentration: memory, attention, language, and fund of knowledge intact  Insight: fair Judgment: fair  Interventions:  Conducted a brief chart review Provided empathic reflections and validation Reviewed content from the previous session Provided positive reinforcement Employed supportive psychotherapy interventions to facilitate reduced distress and to improve coping skills with identified stressors Psychoeducation provided regarding triggers for emotional eating behaviors  DSM-5 Diagnosis(es):  F50.89 Other Specified Feeding or Eating Disorder, Emotional Eating Behaviors and F41.9 Unspecified Anxiety Disorder  Treatment Goal & Progress: During the initial appointment with this provider, the following treatment goal was established: increase coping skills. Suszanne has demonstrated progress in her goal as evidenced by increased awareness of hunger patterns. Tsuneko also continues to demonstrate willingness to engage in learned skill(s).  Plan: The next appointment is scheduled for 04/01/2023 at 11:30am, which will be via MyChart Video Visit. The next session will focus on working towards the established treatment goal.

## 2023-03-06 ENCOUNTER — Ambulatory Visit (INDEPENDENT_AMBULATORY_CARE_PROVIDER_SITE_OTHER): Payer: No Typology Code available for payment source | Admitting: Family Medicine

## 2023-03-06 VITALS — BP 169/96 | HR 72 | Temp 99.1°F | Ht 68.0 in | Wt 343.0 lb

## 2023-03-06 DIAGNOSIS — I10 Essential (primary) hypertension: Secondary | ICD-10-CM | POA: Diagnosis not present

## 2023-03-06 DIAGNOSIS — E559 Vitamin D deficiency, unspecified: Secondary | ICD-10-CM

## 2023-03-06 DIAGNOSIS — Z6841 Body Mass Index (BMI) 40.0 and over, adult: Secondary | ICD-10-CM | POA: Diagnosis not present

## 2023-03-06 DIAGNOSIS — E669 Obesity, unspecified: Secondary | ICD-10-CM | POA: Diagnosis not present

## 2023-03-06 MED ORDER — VITAMIN D (ERGOCALCIFEROL) 1.25 MG (50000 UNIT) PO CAPS
50000.0000 [IU] | ORAL_CAPSULE | ORAL | 0 refills | Status: DC
Start: 2023-03-06 — End: 2023-04-30

## 2023-03-06 MED ORDER — LOSARTAN POTASSIUM-HCTZ 100-25 MG PO TABS: 1.0000 | ORAL_TABLET | Freq: Every day | ORAL | 0 refills | Status: DC

## 2023-03-06 NOTE — Progress Notes (Signed)
Chief Complaint:   OBESITY Tabitha Golden is here to discuss her progress with her obesity treatment plan along with follow-up of her obesity related diagnoses. Tabitha Golden is on the Category 4 Plan and states she is following her eating plan approximately 90% of the time. Tabitha Golden states she is walking for 20-30 minutes 2 times per week.  Today's visit was #: 10 Starting weight: 366 lbs Starting date: 10/03/2022 Today's weight: 343 lbs Today's date: 03/06/2023 Total lbs lost to date: 23 Total lbs lost since last in-office visit: 0  Interim History: Patient presents for follow up from 8/21.  Last few weeks she has been fairly compliant on plan.  Has had quite a few celebrations over the last few weeks.  Meal planning has gotten easier since school started and last week she did more prep beforehand.  Next few weeks she has her daughter's birthday party in mid October.   Subjective:   1. Essential hypertension Patient's blood pressure is elevated again today.  She denies chest pain, chest pressure, or headache.  She is on losartan-hydrochlorothiazide.  2. Vitamin D deficiency Patient is on prescription vitamin D.  She denies nausea, vomiting, or muscle weakness, but notes fatigue.  Assessment/Plan:   1. Essential hypertension Patient agreed to increase Hyzaar to 100-25 mg once daily, and we will refill for 1 month.  - losartan-hydrochlorothiazide (HYZAAR) 100-25 MG tablet; Take 1 tablet by mouth daily.  Dispense: 30 tablet; Refill: 0  2. Vitamin D deficiency We will refill prescription vitamin D 50,000 IU once weekly for 1 month.  - Vitamin D, Ergocalciferol, (DRISDOL) 1.25 MG (50000 UNIT) CAPS capsule; Take 1 capsule (50,000 Units total) by mouth every 7 (seven) days.  Dispense: 4 capsule; Refill: 0  3. BMI 50.0-59.9, adult (HCC)  4. Obesity, Beginning BMI of 55.8 Tabitha Golden is currently in the action stage of change. As such, her goal is to continue with weight loss efforts. She has agreed to  the Category 4 Plan.   Exercise goals: All adults should avoid inactivity. Some physical activity is better than none, and adults who participate in any amount of physical activity gain some health benefits.  Behavioral modification strategies: increasing lean protein intake, meal planning and cooking strategies, keeping healthy foods in the home, and planning for success.  Tabitha Golden has agreed to follow-up with our clinic in 3 to 4 weeks. She was informed of the importance of frequent follow-up visits to maximize her success with intensive lifestyle modifications for her multiple health conditions.   Objective:   Blood pressure (!) 169/96, pulse 72, temperature 99.1 F (37.3 C), height 5\' 8"  (1.727 m), weight (!) 343 lb (155.6 kg), SpO2 96%. Body mass index is 52.15 kg/m.  General: Cooperative, alert, well developed, in no acute distress. HEENT: Conjunctivae and lids unremarkable. Cardiovascular: Regular rhythm.  Lungs: Normal work of breathing. Neurologic: No focal deficits.   Lab Results  Component Value Date   CREATININE 0.75 10/03/2022   BUN 12 10/03/2022   NA 140 10/03/2022   K 3.9 10/03/2022   CL 101 10/03/2022   CO2 21 10/03/2022   Lab Results  Component Value Date   ALT 23 10/03/2022   AST 20 10/03/2022   ALKPHOS 95 10/03/2022   BILITOT 0.3 10/03/2022   Lab Results  Component Value Date   HGBA1C 6.2 (H) 10/03/2022   Lab Results  Component Value Date   INSULIN 68.5 (H) 10/03/2022   Lab Results  Component Value Date   TSH 2.920  10/03/2022   Lab Results  Component Value Date   CHOL 152 10/03/2022   HDL 34 (L) 10/03/2022   LDLCALC 97 10/03/2022   TRIG 116 10/03/2022   Lab Results  Component Value Date   VD25OH 19.0 (L) 10/03/2022   Lab Results  Component Value Date   WBC 7.5 10/03/2022   HGB 12.4 10/03/2022   HCT 38.7 10/03/2022   MCV 76 (L) 10/03/2022   PLT 350 10/03/2022   Lab Results  Component Value Date   IRON 31 10/03/2022   TIBC 393  10/03/2022   FERRITIN 21 10/03/2022   Attestation Statements:   Reviewed by clinician on day of visit: allergies, medications, problem list, medical history, surgical history, family history, social history, and previous encounter notes.   I, Burt Knack, am acting as transcriptionist for Reuben Likes, MD.  I have reviewed the above documentation for accuracy and completeness, and I agree with the above. Reuben Likes, MD'

## 2023-04-01 ENCOUNTER — Telehealth (INDEPENDENT_AMBULATORY_CARE_PROVIDER_SITE_OTHER): Payer: No Typology Code available for payment source | Admitting: Psychology

## 2023-04-01 ENCOUNTER — Other Ambulatory Visit (INDEPENDENT_AMBULATORY_CARE_PROVIDER_SITE_OTHER): Payer: Self-pay | Admitting: Family Medicine

## 2023-04-01 DIAGNOSIS — F419 Anxiety disorder, unspecified: Secondary | ICD-10-CM | POA: Diagnosis not present

## 2023-04-01 DIAGNOSIS — I10 Essential (primary) hypertension: Secondary | ICD-10-CM

## 2023-04-01 DIAGNOSIS — F5089 Other specified eating disorder: Secondary | ICD-10-CM

## 2023-04-01 NOTE — Progress Notes (Signed)
  Office: [phone]  /  Fax: [phone]    Date: April 01, 2023  Appointment Start Time: 11:35am Duration: 26 minutes Provider: Lawerance Cruel, Psy.D. Type of Session: Individual Therapy  Location of Patient: Home (private location) Location of Provider: Provider's Home (private office) Type of Contact: Telepsychological Visit via MyChart Video Visit  Session Content: This provider called Tabitha Golden at 11:33am as she did not present for today's appointment. It appears she lost track of time, but noted she could join the appointment. As such, today's appointment was initiated 5 minutes late.Tabitha Golden is a 37 y.o. female presenting for a follow-up appointment to address the previously established treatment goal of increasing coping skills.Today's appointment was a telepsychological visit. Tabitha Golden provided verbal consent for today's telepsychological appointment and she is aware she is responsible for securing confidentiality on her end of the session. Prior to proceeding with today's appointment, [name]'s physical location at the time of this appointment was obtained as well a phone number she could be reached at in the event of technical difficulties. Tabitha Golden and this provider participated in today's telepsychological service.   This provider conducted a brief check-in. [name] shared she is doing "pretty good," adding "There's a lot going on." Regarding eating habits, she stated she  is "trying to stick to the plan." Reviewed triggers for emotional eating behaviors. [name] was engaged in problem solving to develop a plan to help cope with urges/cravings involving activities to relax, activities to distract, comforting places, people to call and connect with, and activities that help soothe senses. She was observed writing the plan. Overall, [name] was receptive to today's appointment as evidenced by openness to sharing, responsiveness to feedback, and willingness to implement discussed strategies .  Mental  Status Examination:  Appearance: neat Behavior: appropriate to circumstances Mood: neutral Affect: mood congruent Speech: WNL Eye Contact: appropriate Psychomotor Activity: WNL Gait: unable to assess Thought Process: linear, logical, and goal directed and no evidence or endorsement of suicidal, homicidal, and self-harm ideation, plan and intent  Thought Content/Perception: no hallucinations, delusions, bizarre thinking or behavior endorsed or observed Orientation: AAOx4 Memory/Concentration: intact Insight: fair Judgment: fair  Interventions:  Conducted a brief chart review Provided empathic reflections and validation Reviewed content from the previous session Provided positive reinforcement Employed supportive psychotherapy interventions to facilitate reduced distress and to improve coping skills with identified stressors Engaged patient in problem solving  DSM-5 Diagnosis(es): F50.89 Other Specified Feeding or Eating Disorder, Emotional Eating Behaviors and F41.9 Unspecified Anxiety Disorder  Treatment Goal & Progress: During the initial appointment with this provider, the following treatment goal was established: increase coping skills. [name] has demonstrated progress in her goal as evidenced by increased awareness of hunger patterns and increased awareness of triggers for emotional eating behaviors. [name] also continues to demonstrate willingness to engage in learned skill(s).  Plan: The next appointment is scheduled for 04/15/2023 at 12pm, which will be via MyChart Video Visit. The next session will focus on working towards the established treatment goal.

## 2023-04-03 ENCOUNTER — Ambulatory Visit (INDEPENDENT_AMBULATORY_CARE_PROVIDER_SITE_OTHER): Payer: No Typology Code available for payment source | Admitting: Family Medicine

## 2023-04-12 ENCOUNTER — Other Ambulatory Visit (INDEPENDENT_AMBULATORY_CARE_PROVIDER_SITE_OTHER): Payer: Self-pay | Admitting: Family Medicine

## 2023-04-12 DIAGNOSIS — I10 Essential (primary) hypertension: Secondary | ICD-10-CM

## 2023-04-12 DIAGNOSIS — E559 Vitamin D deficiency, unspecified: Secondary | ICD-10-CM

## 2023-04-14 ENCOUNTER — Encounter (INDEPENDENT_AMBULATORY_CARE_PROVIDER_SITE_OTHER): Payer: Self-pay

## 2023-04-15 ENCOUNTER — Other Ambulatory Visit (INDEPENDENT_AMBULATORY_CARE_PROVIDER_SITE_OTHER): Payer: Self-pay | Admitting: Family Medicine

## 2023-04-15 ENCOUNTER — Telehealth (INDEPENDENT_AMBULATORY_CARE_PROVIDER_SITE_OTHER): Payer: Self-pay | Admitting: Physician Assistant

## 2023-04-15 ENCOUNTER — Encounter (INDEPENDENT_AMBULATORY_CARE_PROVIDER_SITE_OTHER): Payer: Self-pay

## 2023-04-15 ENCOUNTER — Other Ambulatory Visit (INDEPENDENT_AMBULATORY_CARE_PROVIDER_SITE_OTHER): Payer: Self-pay

## 2023-04-15 ENCOUNTER — Telehealth (INDEPENDENT_AMBULATORY_CARE_PROVIDER_SITE_OTHER): Payer: No Typology Code available for payment source | Admitting: Psychology

## 2023-04-15 DIAGNOSIS — I10 Essential (primary) hypertension: Secondary | ICD-10-CM

## 2023-04-15 MED ORDER — LOSARTAN POTASSIUM-HCTZ 100-25 MG PO TABS: 1.0000 | ORAL_TABLET | Freq: Every day | ORAL | 0 refills | Status: DC

## 2023-04-15 NOTE — Telephone Encounter (Signed)
Prescription was sent to the pharmacy for losartan only. Message sent to pt-CS

## 2023-04-15 NOTE — Telephone Encounter (Signed)
Pt is completely out of Losartan. Pt wants a refill today. [facility].

## 2023-04-16 ENCOUNTER — Encounter (INDEPENDENT_AMBULATORY_CARE_PROVIDER_SITE_OTHER): Payer: Self-pay | Admitting: Physician Assistant

## 2023-04-16 ENCOUNTER — Ambulatory Visit (INDEPENDENT_AMBULATORY_CARE_PROVIDER_SITE_OTHER): Payer: No Typology Code available for payment source | Admitting: Physician Assistant

## 2023-04-16 VITALS — BP 126/85 | HR 81 | Temp 98.2°F | Ht 68.0 in | Wt 342.0 lb

## 2023-04-16 DIAGNOSIS — D508 Other iron deficiency anemias: Secondary | ICD-10-CM | POA: Diagnosis not present

## 2023-04-16 DIAGNOSIS — F339 Major depressive disorder, recurrent, unspecified: Secondary | ICD-10-CM

## 2023-04-16 DIAGNOSIS — Z6841 Body Mass Index (BMI) 40.0 and over, adult: Secondary | ICD-10-CM

## 2023-04-16 DIAGNOSIS — E669 Obesity, unspecified: Secondary | ICD-10-CM | POA: Diagnosis not present

## 2023-04-16 DIAGNOSIS — I10 Essential (primary) hypertension: Secondary | ICD-10-CM | POA: Diagnosis not present

## 2023-04-16 DIAGNOSIS — F5089 Other specified eating disorder: Secondary | ICD-10-CM

## 2023-04-16 MED ORDER — FUSION PLUS PO CAPS: 1.0000 | ORAL_CAPSULE | Freq: Every day | ORAL | 0 refills | Status: DC

## 2023-04-16 MED ORDER — SERTRALINE HCL 50 MG PO TABS: 50.0000 mg | ORAL_TABLET | Freq: Every day | ORAL | 0 refills | Status: DC

## 2023-04-16 NOTE — Progress Notes (Signed)
.[facility]  Office: [phone]  /  Fax: [phone]  WEIGHT SUMMARY AND BIOMETRICS  Vitals Temp: 98.2 F (36.8 C) BP: 126/85 Pulse Rate: 81 SpO2: 97 %   Anthropometric Measurements Height: 5\' 8"  (1.727 m) Weight: (!) 342 lb (155.1 kg) BMI (Calculated): 52.01 Weight at Last Visit: 343 LB Weight Lost Since Last Visit: 1 lb Weight Gained Since Last Visit: 0 Starting Weight: 366 LB Total Weight Loss (lbs): 24 lb (10.9 kg)   Body Composition  Body Fat %: 52.8 % Fat Mass (lbs): 180.6 lbs Muscle Mass (lbs): 153.4 lbs Total Body Water (lbs): 119.2 lbs Visceral Fat Rating : 19   Other Clinical Data Fasting: NO Labs: NO Today's Visit #: 11 Starting Date: 10/03/22     HPI  Chief Complaint: OBESITY  Tabitha Golden is here to discuss her progress with her obesity treatment plan. She is on the the Category 4 Plan and states she is following her eating plan approximately 85 % of the time. She states she is exercising Walking 15 minutes 1 times per week.   Interval History:  Since last office visit she is down 1 lb.   The patient, with a history of obesity, hypertension, and iron deficiency anemia, presents for a follow-up of her obesity treatment plan. She reports recent life events such as children's birthday parties and a mini vacation, which have led to occasional deviations from her dietary plan.  Despite these deviations, she has managed to build more than two pounds of muscle and lose 3.2 pounds of adipose tissue.  The patient's typical breakfast consists of eggs, sausage, and toast with sugar-free jelly. On days when she is in the office, she substitutes scrambled eggs with egg frittatas. Lunch usually involves eating out, where she focuses on consuming her dinner protein and calories during the middle of the day. Her go-to meals include a [employer] sandwich with grilled nuggets, brown rice and chicken with veggies, or a barbecue sandwich from [employer]. If she doesn't eat out, she  opts for a [employer] or leftovers from the previous night.  The patient acknowledges that she has not been very active, citing a busy schedule with work and her children's activities. She expresses a desire to incorporate more physical activity into her routine, possibly during her children's football practice or during her lunch break when working from home.  The patient admits to struggling with willpower, especially when unhealthy food options are readily available. However, she has found success in maintaining her protein intake and drinking water consistently. She acknowledges the need for more consistent meal planning and prepping, and expresses interest in finding healthy crockpot recipes.  We discussed [employer] as a site for recipes and discussed goal of 1800-2000 Calories and 120 grams of protein daily.   Pharmacotherapy:  Zoloft for emotional eating. No side effects. Mood stable.   TREATMENT PLAN FOR OBESITY: Obesity Continued weight loss with a total of 24 pounds lost. The patient has been adhering to a high protein diet and has made adjustments to accommodate her work schedule. She has been experiencing some challenges with emotional eating and maintaining consistency. -Continue high protein diet, aiming for 120g or more daily. -Consider incorporating small protein snacks throughout the day to meet protein goals. -Explore meal planning and prepping resources such as [employer] for healthy crockpot recipes. -Consider incorporating more physical activity into her routine, starting with 15-minute walks 3-4 times a week. Recommended Dietary Goals  Tabitha Golden is currently in the action stage of change. As such, her  goal is to continue weight management plan. She has agreed to the Category 4 Plan and keeping a food journal and adhering to recommended goals of 1800-2000 calories and 120 grams of protein.  Behavioral Intervention  We discussed the following Behavioral  Modification Strategies today: continue to work on maintaining a reduced calorie state, getting the recommended amount of protein, incorporating whole foods, making healthy choices, staying well hydrated and practicing mindfulness when eating..  Additional resources provided today: NA  Recommended Physical Activity Goals  Tabitha Golden has been advised to work up to 150 minutes of moderate intensity aerobic activity a week and strengthening exercises 2-3 times per week for cardiovascular health, weight loss maintenance and preservation of muscle mass.   She has agreed to Think about enjoyable ways to increase daily physical activity and overcoming barriers to exercise, Increase physical activity in their day and reduce sedentary time (increase NEAT)., and start walking 15 minutes 2-3 times weekly.    Pharmacotherapy We discussed various medication options to help [name] with her weight loss efforts and we both agreed to continue to work on nutritional and behavioral strategies to promote weight loss.     Return in about 2 weeks (around 04/30/2023).Marland Kitchen She was informed of the importance of frequent follow up visits to maximize her success with intensive lifestyle modifications for her multiple health conditions.  PHYSICAL EXAM:  Blood pressure 126/85, pulse 81, temperature 98.2 F (36.8 C), height 5\' 8"  (1.727 m), weight (!) 342 lb (155.1 kg), SpO2 97%. Body mass index is 52 kg/m.  General: She is overweight, cooperative, alert, well developed, and in no acute distress. PSYCH: Has normal mood, affect and thought process.   Cardiovascular: HR 80's BP 126/85 Lungs: Normal breathing effort, no conversational dyspnea. Neuro: no focal deficits  DIAGNOSTIC DATA REVIEWED:  BMET    Component Value Date/Time   NA 140 10/03/2022 0957   K 3.9 10/03/2022 0957   CL 101 10/03/2022 0957   CO2 21 10/03/2022 0957   GLUCOSE 92 10/03/2022 0957   GLUCOSE 147 (H) 03/20/2020 1628   BUN 12 10/03/2022 0957    CREATININE 0.75 10/03/2022 0957   CALCIUM 8.6 (L) 10/03/2022 0957   GFRNONAA >60 03/20/2020 1628   GFRAA >60 03/20/2020 1628   Lab Results  Component Value Date   HGBA1C 6.2 (H) 10/03/2022   Lab Results  Component Value Date   INSULIN 68.5 (H) 10/03/2022   Lab Results  Component Value Date   TSH 2.920 10/03/2022   CBC    Component Value Date/Time   WBC 7.5 10/03/2022 0957   WBC 8.3 03/20/2020 1628   RBC 5.08 10/03/2022 0957   RBC 4.98 03/20/2020 1628   HGB 12.4 10/03/2022 0957   HCT 38.7 10/03/2022 0957   PLT 350 10/03/2022 0957   MCV 76 (L) 10/03/2022 0957   MCH 24.4 (L) 10/03/2022 0957   MCH 23.7 (L) 03/20/2020 1628   MCHC 32.0 10/03/2022 0957   MCHC 32.9 03/20/2020 1628   RDW 14.8 10/03/2022 0957   Iron Studies    Component Value Date/Time   IRON 31 10/03/2022 0957   TIBC 393 10/03/2022 0957   FERRITIN 21 10/03/2022 0957   IRONPCTSAT 8 (LL) 10/03/2022 0957   Lipid Panel     Component Value Date/Time   CHOL 152 10/03/2022 0957   TRIG 116 10/03/2022 0957   HDL 34 (L) 10/03/2022 0957   LDLCALC 97 10/03/2022 0957   Hepatic Function Panel     Component Value Date/Time  PROT 6.7 10/03/2022 0957   ALBUMIN 4.0 10/03/2022 0957   AST 20 10/03/2022 0957   ALT 23 10/03/2022 0957   ALKPHOS 95 10/03/2022 0957   BILITOT 0.3 10/03/2022 0957      Component Value Date/Time   TSH 2.920 10/03/2022 0957   Nutritional Lab Results  Component Value Date   VD25OH 19.0 (L) 10/03/2022    ASSOCIATED CONDITIONS ADDRESSED TODAY  ASSESSMENT AND PLAN  Problem List Items Addressed This Visit     Absolute anemia   Relevant Medications   Iron-FA-B Cmp-C-Biot-Probiotic (FUSION PLUS) CAPS   Essential hypertension - Primary   Obesity with starting BMI of 55.8   BMI 50.0-59.9, adult (HCC) Current BMI 52.6   Depression, recurrent (HCC)   Relevant Medications   sertraline (ZOLOFT) 50 MG tablet   Hypertension Hypertension asymptomatic, no significant medication side  effects noted, borderline controlled, and needs further observation.  Medication(s): Losartan-hydrochlorothiazide 100-25 mg daily. Renal function is normal.   BP Readings from Last 3 Encounters:  04/16/23 126/85  03/06/23 (!) 169/96  02/06/23 (!) 142/94   Lab Results  Component Value Date   CREATININE 0.75 10/03/2022   CREATININE 0.81 03/20/2020   CREATININE 0.76 12/06/2015   No results found for: "GFR"  Plan: Continue Losartan-hydrochlorothiazide 100-25 mg daily. Refilled recently.  Continue to work on nutrition plan to promote weight loss and improve BP control.    Iron deficiency anemia Anemia is stable. Iron supplementation: Fusion plus 1 capsule daily. No side effects Lab Results  Component Value Date   WBC 7.5 10/03/2022   HGB 12.4 10/03/2022   HCT 38.7 10/03/2022   MCV 76 (L) 10/03/2022   PLT 350 10/03/2022   Lab Results  Component Value Date   IRON 31 10/03/2022   TIBC 393 10/03/2022   FERRITIN 21 10/03/2022     Plan: Continue supplementation at current dose Recheck labs over the next 2-3 months.   Other depression/emotional eating Tabitha Golden has had issues with stress/emotional eating. Currently this is moderately controlled. Overall mood is stable. Medication(s): Other: zoloft 50 mg daily. No side effects.   Plan: Continue Other: Zoloft 50 mg daily.  Continue strategies for emotional eating behaviors.    Follow-up appointments scheduled for 03/30/2023 and 04/13/2023.  ATTESTASTION STATEMENTS:  Reviewed by clinician on day of visit: allergies, medications, problem list, medical history, surgical history, family history, social history, and previous encounter notes.   I have personally spent 30 minutes total time today in preparation, patient care, nutritional counseling and documentation for this visit, including the following: review of clinical lab tests; review of medical tests/procedures/services.      Keymiah Lyles, PA-C

## 2023-04-30 ENCOUNTER — Ambulatory Visit (INDEPENDENT_AMBULATORY_CARE_PROVIDER_SITE_OTHER): Payer: No Typology Code available for payment source | Admitting: Physician Assistant

## 2023-04-30 ENCOUNTER — Encounter (INDEPENDENT_AMBULATORY_CARE_PROVIDER_SITE_OTHER): Payer: Self-pay | Admitting: Physician Assistant

## 2023-04-30 VITALS — BP 131/83 | HR 84 | Temp 98.4°F | Ht 68.0 in | Wt 340.0 lb

## 2023-04-30 DIAGNOSIS — F339 Major depressive disorder, recurrent, unspecified: Secondary | ICD-10-CM

## 2023-04-30 DIAGNOSIS — Z6841 Body Mass Index (BMI) 40.0 and over, adult: Secondary | ICD-10-CM

## 2023-04-30 DIAGNOSIS — I10 Essential (primary) hypertension: Secondary | ICD-10-CM | POA: Diagnosis not present

## 2023-04-30 DIAGNOSIS — E559 Vitamin D deficiency, unspecified: Secondary | ICD-10-CM | POA: Diagnosis not present

## 2023-04-30 DIAGNOSIS — E669 Obesity, unspecified: Secondary | ICD-10-CM

## 2023-04-30 DIAGNOSIS — R7303 Prediabetes: Secondary | ICD-10-CM | POA: Diagnosis not present

## 2023-04-30 MED ORDER — SERTRALINE HCL 50 MG PO TABS: 50.0000 mg | ORAL_TABLET | Freq: Every day | ORAL | 0 refills | Status: DC

## 2023-04-30 MED ORDER — VITAMIN D (ERGOCALCIFEROL) 1.25 MG (50000 UNIT) PO CAPS: 50000.0000 [IU] | ORAL_CAPSULE | ORAL | 0 refills | Status: DC

## 2023-04-30 NOTE — Progress Notes (Signed)
SUBJECTIVE:  Chief Complaint: Obesity  Interim History: The patient, with a history of obesity, prediabetes, vitamin D deficiency, and emotional eating, presents for a follow-up visit. She reports significant progress in her weight loss journey, having lost 26 pounds. She attributes her success to consistent protein intake and some physical activity, mainly walking. Despite occasional indulgences during holidays and office parties, she has managed to stay on track by ensuring she gets her daily protein intake. She also reports an increase in muscle mass, which she attributes to her diet and physical activity. The patient also mentions taking medication for anxiety, which she believes is helping her manage stress and emotional eating.  Tabitha Golden is here to discuss her progress with her obesity treatment plan. She is on the Category 4 Plan and keeping a food journal and adhering to recommended goals of 1800-2000 calories and 120 grams of protein and states she is following her eating plan approximately 90 % of the time. She states she is exercising 15 minutes 2 times per week.   OBJECTIVE: Visit Diagnoses: Problem List Items Addressed This Visit     Vitamin D deficiency   Relevant Medications   Vitamin D, Ergocalciferol, (DRISDOL) 1.25 MG (50000 UNIT) CAPS capsule   Obesity with starting BMI of 55.8   BMI 50.0-59.9, adult (HCC) Current BMI 52.6   Depression, recurrent (HCC)   Relevant Medications   sertraline (ZOLOFT) 50 MG tablet   Prediabetes - Primary  Obesity Successful weight loss of 26 pounds attributed to consistent protein intake and some physical activity.  Noted increase in muscle mass and decrease in adipose tissue. Blood pressure improved. -Continue current diet and exercise regimen focusing on protein intake. -Implement portion control during holiday meals and maintain physical activity. -Consider potential for reduction in blood pressure medication in the  future.  Prediabetes Last A1c was 6.2- not at goal/ Insulin 68.5 not at goal and significantly elevated.   Medication(s): None Polyphagia:No Significant weight loss of 26 lbs over the past 7 months.  TBW loss of 7.1% Lab Results  Component Value Date   HGBA1C 6.2 (H) 10/03/2022   Lab Results  Component Value Date   INSULIN 68.5 (H) 10/03/2022    Plan: Continue working on nutrition plan to decrease simple carbohydrates, increase lean proteins and exercise to promote weight loss, improve glycemic control and prevent progression to Type 2 diabetes.  Will plan to repeat fasting labs over the next 1-2 months.     Vitamin D Deficiency Vitamin D is not at goal of 50.  Most recent vitamin D level was 19.0. She is on  prescription ergocalciferol 50,000 IU weekly. No N/V or muscle weakness with Ergocalciferol.  Lab Results  Component Value Date   VD25OH 19.0 (L) 10/03/2022    Plan: Continue and refill  prescription ergocalciferol 50,000 IU weekly Low vitamin D levels can be associated with adiposity and may result in leptin resistance and weight gain. Also associated with fatigue. Currently on vitamin D supplementation without any adverse effects.  Recheck vitamin D levels over the next 1-2 months to optimize supplementation/avoid over supplementation.   Other depression/Emotional Eating Tabitha Golden has had issues with stress/emotional eating. Currently this is moderately controlled. Overall mood is stable. Medication(s): Other: Zoloft 50 mg daily. No side effects with Zoloft  Plan: Continue and refill Other: Zoloft 50 mg daily.  Discussed strategies for portion control and mindful eating, particularly during holiday season and stressful periods. -Continue current strategies and consider the "pickle test" for hunger cues.  Continues to follow up with Dr. Dewaine Conger.    Follow-up plans -Next appointment scheduled before Thanksgiving. -Visit with Dr. Dewaine Conger on 05/13/2023.  Vitals Temp:  98.4 F (36.9 C) BP: 131/83 Pulse Rate: 84 SpO2: 97 %   Anthropometric Measurements Height: 5\' 8"  (1.727 m) Weight: (!) 340 lb (154.2 kg) BMI (Calculated): 51.71 Weight at Last Visit: 342 lb Weight Lost Since Last Visit: 2 lb Weight Gained Since Last Visit: 0 Starting Weight: 366 lb Total Weight Loss (lbs): 26 lb (11.8 kg) Peak Weight: 366 lb   Body Composition  Body Fat %: 52.1 % Fat Mass (lbs): 177.6 lbs Muscle Mass (lbs): 155 lbs Total Body Water (lbs): 115.8 lbs Visceral Fat Rating : 18   Other Clinical Data Fasting: no Labs: no Today's Visit #: 12 Starting Date: 10/03/22     ASSESSMENT AND PLAN:  Diet: Tabitha Golden is currently in the action stage of change. As such, her goal is to continue with weight loss efforts. She has agreed to Category 4 Plan and keeping a food journal and adhering to recommended goals of 1800-2000 calories and 120 grams of protein.  Exercise: Tabitha Golden has been instructed that some exercise is better than none and try to increase walking for 15 minutes for at least 4-5 nights weekly  for weight loss and overall health benefits.   Behavior Modification:  We discussed the following Behavioral Modification Strategies today: increasing lean protein intake, decreasing simple carbohydrates, increasing vegetables, increase H2O intake, increase high fiber foods, no skipping meals, better snacking choices, emotional eating strategies , and holiday eating strategies. We discussed various medication options to help Tabitha Golden with her weight loss efforts and we both agreed to continue to work on nutritional and behavioral strategies to promote weight loss.  .  Return in about 2 weeks (around 05/14/2023).Marland Kitchen She was informed of the importance of frequent follow up visits to maximize her success with intensive lifestyle modifications for her multiple health conditions.  Attestation Statements:   Reviewed by clinician on day of visit: allergies, medications,  problem list, medical history, surgical history, family history, social history, and previous encounter notes.   Time spent on visit including pre-visit chart review and post-visit care and charting was 33 minutes.    Jayona Mccaig, PA-C

## 2023-05-07 ENCOUNTER — Other Ambulatory Visit (INDEPENDENT_AMBULATORY_CARE_PROVIDER_SITE_OTHER): Payer: Self-pay | Admitting: Family Medicine

## 2023-05-07 DIAGNOSIS — I10 Essential (primary) hypertension: Secondary | ICD-10-CM

## 2023-05-11 ENCOUNTER — Other Ambulatory Visit (INDEPENDENT_AMBULATORY_CARE_PROVIDER_SITE_OTHER): Payer: Self-pay | Admitting: Family Medicine

## 2023-05-11 DIAGNOSIS — I10 Essential (primary) hypertension: Secondary | ICD-10-CM

## 2023-05-13 ENCOUNTER — Telehealth (INDEPENDENT_AMBULATORY_CARE_PROVIDER_SITE_OTHER): Payer: No Typology Code available for payment source | Admitting: Psychology

## 2023-05-13 DIAGNOSIS — F5089 Other specified eating disorder: Secondary | ICD-10-CM | POA: Diagnosis not present

## 2023-05-13 DIAGNOSIS — F419 Anxiety disorder, unspecified: Secondary | ICD-10-CM | POA: Diagnosis not present

## 2023-05-13 NOTE — Progress Notes (Signed)
  Office: [phone]  /  Fax: [phone]    Date: May 13, 2023  Appointment Start Time: 2:34pm Duration: 27 minutes Provider: Lawerance Cruel, Psy.D. Type of Session: Individual Therapy  Location of Patient: Home (private location) Location of Provider: Provider's Home (private office) Type of Contact: Telepsychological Visit via MyChart Video Visit  Session Content: Tabitha Golden is a 37 y.o. female presenting for a follow-up appointment to address the previously established treatment goal of increasing coping skills.Today's appointment was a telepsychological visit. Tabitha Golden provided verbal consent for today's telepsychological appointment and she is aware she is responsible for securing confidentiality on her end of the session. Prior to proceeding with today's appointment, Tabitha Golden's physical location at the time of this appointment was obtained as well a phone number she could be reached at in the event of technical difficulties. Tabitha Golden and this provider participated in today's telepsychological service.   This provider conducted a brief check-in. [name] shared about recent events, including preparing for the holidays. Psychoeducation regarding making better choices and engaging in portion control during the holidays/celebrations/vacations was provided. More specifically, this provider discussed the following strategies: coming to meals hungry, but not starving; avoid filling up on appetizers; managing portion sizes; not completely depriving yourself; making the plate colorful (e.g., vegetables); pacing yourself (e.g., waiting 10 minutes before going back for seconds); taking advantage of the nutritious foods; practicing mindfulness; staying hydrated; and avoid bringing home leftovers. Overall, [name] was receptive to today's appointment as evidenced by openness to sharing, responsiveness to feedback, and willingness to implement discussed strategies .  Mental Status Examination:  Appearance:  neat Behavior: appropriate to circumstances Mood: neutral Affect: mood congruent Speech: WNL Eye Contact: appropriate Psychomotor Activity: WNL Gait: unable to assess Thought Process: linear, logical, and goal directed and no evidence or endorsement of suicidal, homicidal, and self-harm ideation, plan and intent  Thought Content/Perception: no hallucinations, delusions, bizarre thinking or behavior endorsed or observed Orientation: AAOx4 Memory/Concentration: intact Insight: good Judgment: good  Interventions:  Conducted a brief chart review Provided empathic reflections and validation Provided positive reinforcement Employed supportive psychotherapy interventions to facilitate reduced distress and to improve coping skills with identified stressors Psychoeducation provided regarding strategies for celebrations/holidays/vacations  DSM-5 Diagnosis(es): F50.89 Other Specified Feeding or Eating Disorder, Emotional Eating Behaviors and F41.9 Unspecified Anxiety Disorder  Treatment Goal & Progress: During the initial appointment with this provider, the following treatment goal was established: increase coping skills. [name] has demonstrated progress in her goal as evidenced by increased awareness of hunger patterns and increased awareness of triggers for emotional eating behaviors. [name] also continues to demonstrate willingness to engage in learned skill(s).  Plan: The next appointment is scheduled for 06/04/2023 at 12:30pm, which will be via MyChart Video Visit. The next session will focus on working towards the established treatment goal.

## 2023-05-14 ENCOUNTER — Ambulatory Visit (INDEPENDENT_AMBULATORY_CARE_PROVIDER_SITE_OTHER): Payer: No Typology Code available for payment source | Admitting: Physician Assistant

## 2023-05-14 ENCOUNTER — Encounter (INDEPENDENT_AMBULATORY_CARE_PROVIDER_SITE_OTHER): Payer: Self-pay | Admitting: Physician Assistant

## 2023-05-14 VITALS — BP 150/83 | HR 78 | Temp 98.4°F | Ht 68.0 in | Wt 336.0 lb

## 2023-05-14 DIAGNOSIS — E669 Obesity, unspecified: Secondary | ICD-10-CM

## 2023-05-14 DIAGNOSIS — E559 Vitamin D deficiency, unspecified: Secondary | ICD-10-CM | POA: Diagnosis not present

## 2023-05-14 DIAGNOSIS — I10 Essential (primary) hypertension: Secondary | ICD-10-CM | POA: Diagnosis not present

## 2023-05-14 DIAGNOSIS — Z6841 Body Mass Index (BMI) 40.0 and over, adult: Secondary | ICD-10-CM

## 2023-05-14 DIAGNOSIS — F5089 Other specified eating disorder: Secondary | ICD-10-CM | POA: Diagnosis not present

## 2023-05-14 MED ORDER — LOSARTAN POTASSIUM-HCTZ 100-25 MG PO TABS: 1.0000 | ORAL_TABLET | Freq: Every day | ORAL | 0 refills | Status: DC

## 2023-05-14 NOTE — Progress Notes (Signed)
SUBJECTIVE:  Chief Complaint: Obesity Discussed the use of AI scribe software for clinical note transcription with the patient, who gave verbal consent to proceed.  History of Present Illness        Interim History: The patient is a 37 year old female with a history of obesity, vitamin D deficiency, prediabetes, emotional eating behaviors, and hypertension. She has been following a treatment plan for obesity and has made significant progress, with a weight loss of 30 pounds.  The patient has been mindful of her eating habits, making healthier choices even during special occasions. For instance, she opted for chicken and green beans instead of a large bowl of Cajun pasta during a recent visit to a restaurant. She also limited her intake of cheesecake, consuming it over two days instead of in one sitting.  Despite these positive changes, the patient has been struggling with emotional eating behaviors. She has been working on portion control and focusing on protein sources to avoid overindulging in carbohydrates.  The patient has been taking vitamin D supplements and losartan for hypertension. She recently picked up a refill of these medications. However, her blood pressure was slightly elevated during the visit, which could be due to various factors.  The patient has been engaging in physical activity, including walking outdoors. However, the frequency and duration of these activities could be increased to further support her weight loss efforts.  The patient is anticipating the holiday season, which she acknowledges as a challenging time due to constant temptations and social events. She has been discussing strategies to navigate these challenges, such as portion control during holiday meals and avoiding bringing home unhealthy leftovers. She also plans to increase her physical activity during this period.  Tabitha Golden is here to discuss her progress with her obesity treatment plan. She is on the  Category 4 Plan and keeping a food journal and adhering to recommended goals of 1800-2000 calories and 120+ grams of  protein and states she is following her eating plan approximately 92-95 % of the time. She states she is exercising walking 15 minutes 2 times per week.   OBJECTIVE: Visit Diagnoses: Problem List Items Addressed This Visit     Vitamin D deficiency   Essential hypertension - Primary   Relevant Medications   losartan-hydrochlorothiazide (HYZAAR) 100-25 MG tablet   Obesity with starting BMI of 55.8   Other Specified Feeding or Eating Disorder, Emotional Eating Behaviors  Obesity Lost 30 pounds overall TBW loss of 8.2% . Making mindful dietary choices and planning strategies for holiday meals and social events. Increasing physical activity, particularly walking, discussed as beneficial next step. Benefits of weight loss on overall health and potential reduction in hypertension and prediabetes risk discussed. - Increase walking duration or frequency - Continue mindful eating and portion control, especially during holidays and social events  Emotional Eating Behaviors Progress in managing emotional eating through mindful food choices and portion sizes. Discussed strategies to manage emotional eating, especially during holidays and social events   On Zoloft 50 mg daily with good control and no side effects. Mood stable.  Continue Zoloft 50 mg daily- no refill needed this visit.  - Continue mindful eating practices - Implement strategies to manage emotional eating, especially during holidays and social events Continue with Dr. Dewaine Conger for emotional eating behaviors.    Hypertension Blood pressure slightly elevated today, possibly due to recent activity. Currently on losartan-hydrochlorothiazide 100-25 mg daily.. Further weight loss expected to help manage blood pressure. Discussed importance of regular monitoring and  potential adjustments based on readings. Continue to work on  nutrition plan to promote weight loss and improve BP control.  Continue/refill Losartan-hydrochlorothiazide 100-25 mg daily.   Vitamin D Deficiency Vitamin D is not at goal of 50.  Most recent vitamin D level was 19.0. She is on  prescription ergocalciferol 50,000 IU weekly. No N/V or muscle weakness with Ergocalciferol.  Lab Results  Component Value Date   VD25OH 19.0 (L) 10/03/2022    Plan: Continue  prescription ergocalciferol 50,000 IU weekly Low vitamin D levels can be associated with adiposity and may result in leptin resistance and weight gain. Also associated with fatigue. Currently on vitamin D supplementation without any adverse effects.  Plan to recheck vitamin D level over the next 1-2 visits to optimize supplementation/avoid over supplementation.   Follow-up - Follow up with Dr. Dewaine Conger on December 17th - Follow up with Dr. Cathie Hoops on December 18th - Follow up with the current provider on January 8th.  Vitals Temp: 98.4 F (36.9 C) BP: (!) 150/83 Pulse Rate: 78 SpO2: 97 %   Anthropometric Measurements Height: 5\' 8"  (1.727 m) Weight: (!) 336 lb (152.4 kg) BMI (Calculated): 51.1 Weight at Last Visit: 340 lb Weight Lost Since Last Visit: 4 lb Weight Gained Since Last Visit: 0 Starting Weight: 366 lb Total Weight Loss (lbs): 30 lb (13.6 kg) Peak Weight: 366 lb   Body Composition  Body Fat %: 52.1 % Fat Mass (lbs): 175.4 lbs Muscle Mass (lbs): 153.2 lbs Total Body Water (lbs): 116.4 lbs Visceral Fat Rating : 18   Other Clinical Data Fasting: no Labs: no Today's Visit #: 13 Starting Date: 10/03/22     ASSESSMENT AND PLAN:  Diet: Tabitha Golden is currently in the action stage of change. As such, her goal is to continue with weight loss efforts. She has agreed to Category 4 Plan and keeping a food journal and adhering to recommended goals of 1800-2000 calories and 120+ grams of  protein.  Exercise: Tabitha Golden has been instructed  increase either frequency or duration  of walking for goal of at least 20 minutes 5 days weekly  for weight loss and overall health benefits.   Behavior Modification:  We discussed the following Behavioral Modification Strategies today: increasing lean protein intake, decreasing simple carbohydrates, increasing vegetables, increase H2O intake, increase high fiber foods, ways to avoid boredom eating, emotional eating strategies , holiday eating strategies, and planning for success. We discussed various medication options to help Tabitha Golden with her weight loss efforts and we both agreed to continue to work on nutritional and behavioral strategies to promote weight loss.  .  Return in about 3 weeks (around 06/04/2023).Marland Kitchen She was informed of the importance of frequent follow up visits to maximize her success with intensive lifestyle modifications for her multiple health conditions.  Attestation Statements:   Reviewed by clinician on day of visit: allergies, medications, problem list, medical history, surgical history, family history, social history, and previous encounter notes.   Time spent on visit including pre-visit chart review and post-visit care and charting was 32 minutes.    Kron Everton, PA-C

## 2023-06-02 ENCOUNTER — Other Ambulatory Visit (INDEPENDENT_AMBULATORY_CARE_PROVIDER_SITE_OTHER): Payer: Self-pay | Admitting: Family Medicine

## 2023-06-02 DIAGNOSIS — E559 Vitamin D deficiency, unspecified: Secondary | ICD-10-CM

## 2023-06-04 ENCOUNTER — Telehealth (INDEPENDENT_AMBULATORY_CARE_PROVIDER_SITE_OTHER): Payer: No Typology Code available for payment source | Admitting: Psychology

## 2023-06-04 DIAGNOSIS — F5089 Other specified eating disorder: Secondary | ICD-10-CM | POA: Diagnosis not present

## 2023-06-04 DIAGNOSIS — F419 Anxiety disorder, unspecified: Secondary | ICD-10-CM | POA: Diagnosis not present

## 2023-06-04 NOTE — Progress Notes (Signed)
  Office: [phone]  /  Fax: [phone]    Date: June 04, 2023  Appointment Start Time: 12:36pm Duration: 20 minutes Provider: Lawerance Cruel, Psy.D. Type of Session: Individual Therapy  Location of Patient: Work (private location) Location of Provider: Provider's Home (private office) Type of Contact: Telepsychological Visit via MyChart Video Visit  Session Content: Tabitha Golden is a 37 y.o. female presenting for a follow-up appointment to address the previously established treatment goal of increasing coping skills.Today's appointment was a telepsychological visit. Tabitha Golden provided verbal consent for today's telepsychological appointment and she is aware she is responsible for securing confidentiality on her end of the session. Prior to proceeding with today's appointment, Tabitha Golden's physical location at the time of this appointment was obtained as well a phone number she could be reached at in the event of technical difficulties. Tabitha Golden and this provider participated in today's telepsychological service.   Of note, today's appointment was switched to a regular telephone call at 12:46pm with Tabitha Golden verbal consent due to technical issues.   This provider conducted a brief check-in. Tabitha Golden shared about recent events, noting she is in a "good place this year." Discussed what went well as it relates to eating during Thanksgiving and what she could differently during future holidays. Psychoeducation provided regarding mindful eating strategies (sit down, slowly chew, savor, simplify, and smile). Overall, Tabitha Golden was receptive to today's appointment as evidenced by openness to sharing, responsiveness to feedback, and willingness to implement discussed strategies .  Mental Status Examination:  Appearance: neat Behavior: appropriate to circumstances Mood: neutral Affect: mood congruent Speech: WNL Eye Contact: appropriate Psychomotor Activity: WNL Gait: unable to assess Thought Process: linear,  logical, and goal directed and no evidence or endorsement of suicidal, homicidal, and self-harm ideation, plan and intent  Thought Content/Perception: no hallucinations, delusions, bizarre thinking or behavior endorsed or observed Orientation: AAOx4 Memory/Concentration: intact Insight: good Judgment: good  Interventions:  Conducted a brief chart review Provided empathic reflections and validation Reviewed content from the previous session Provided positive reinforcement Employed supportive psychotherapy interventions to facilitate reduced distress and to improve coping skills with identified stressors Psychoeducation provided regarding mindfulness  DSM-5 Diagnosis(es):  F50.89 Other Specified Feeding or Eating Disorder, Emotional Eating Behaviors and F41.9 Unspecified Anxiety Disorder  Treatment Goal & Progress: During the initial appointment with this provider, the following treatment goal was established: increase coping skills. Tabitha Golden has demonstrated progress in her goal as evidenced by increased awareness of hunger patterns, increased awareness of triggers for emotional eating behaviors, and reduction in emotional eating behaviors . Tabitha Golden also continues to demonstrate willingness to engage in learned skill(s).  Plan: The next appointment is scheduled for 07/09/2023 at 2:30pm, which will be via MyChart Video Visit. The next session will focus on working towards the established treatment goal.

## 2023-06-05 ENCOUNTER — Ambulatory Visit (INDEPENDENT_AMBULATORY_CARE_PROVIDER_SITE_OTHER): Payer: No Typology Code available for payment source | Admitting: Family Medicine

## 2023-06-20 ENCOUNTER — Ambulatory Visit (INDEPENDENT_AMBULATORY_CARE_PROVIDER_SITE_OTHER): Payer: No Typology Code available for payment source | Admitting: Physician Assistant

## 2023-06-20 ENCOUNTER — Encounter (INDEPENDENT_AMBULATORY_CARE_PROVIDER_SITE_OTHER): Payer: Self-pay | Admitting: Physician Assistant

## 2023-06-20 VITALS — BP 130/85 | HR 82 | Temp 98.2°F | Ht 68.0 in | Wt 340.0 lb

## 2023-06-20 DIAGNOSIS — I10 Essential (primary) hypertension: Secondary | ICD-10-CM

## 2023-06-20 DIAGNOSIS — E669 Obesity, unspecified: Secondary | ICD-10-CM

## 2023-06-20 DIAGNOSIS — R7303 Prediabetes: Secondary | ICD-10-CM | POA: Diagnosis not present

## 2023-06-20 DIAGNOSIS — E559 Vitamin D deficiency, unspecified: Secondary | ICD-10-CM

## 2023-06-20 DIAGNOSIS — Z6841 Body Mass Index (BMI) 40.0 and over, adult: Secondary | ICD-10-CM

## 2023-06-20 MED ORDER — VITAMIN D (ERGOCALCIFEROL) 1.25 MG (50000 UNIT) PO CAPS: 50000.0000 [IU] | ORAL_CAPSULE | ORAL | 0 refills | Status: DC

## 2023-06-20 NOTE — Progress Notes (Signed)
 SUBJECTIVE:  Chief Complaint: Obesity  Interim History: She is up 4 lbs since her last visit.  Down 26 lbs overall.  The patient, a 38 year old individual with a history of obesity, prediabetes, hypertension, vitamin D  deficiency, and emotional eating behaviors, presents for a follow-up visit regarding her obesity treatment plan. She is currently on losartan -hydrochlorothiazide 100/25mg  daily for hypertension, ergocalciferol  50,000 units weekly for vitamin D  deficiency, and Zoloft  for emotional eating.  Over the holiday period, the patient reports being exposed to foods she is not typically around. Despite this, she made an effort to maintain her protein intake and did not binge eat. She did, however, consume some extra food items. The patient also reports a slight increase in water weight, which she attributes to the holiday period.  The patient has been given a 30-day ab challenge by Eve, which she is considering due to her history of abdominal surgeries and weak core muscles. She is also considering increasing her walking regimen. She reports being active, including shopping and other activities that involve walking. She is considering using her phone to monitor her steps.  The patient has a new treadmill at home, which she is planning to incorporate into her routine. She also expresses a desire to change her lifestyle habits, including reducing screen time for her children and increasing family activities such as walking and bike riding.  The patient is following a category four diet plan, aiming for around 120 grams of protein and 1800-2000 calories per day. She reports no issues with consuming the same meals regularly and has go-to meals for when she eats out. She reports no consumption of sugary beverages, occasionally having a zero-calorie soda. She also reports drinking a lot of water.  The patient expresses a preference for regular follow-up visits, finding them helpful for  maintaining accountability. She is scheduled for a follow-up visit in two weeks. She is also due for a refill of her vitamin D  medication, ergocalciferol , which she reports no issues with.  Dejanay is here to discuss her progress with her obesity treatment plan. She is on the Category 4 Plan and states she is following her eating plan approximately 85 % of the time. She states she is exercising walking 15 minutes 2 times per week.   OBJECTIVE: Visit Diagnoses: Problem List Items Addressed This Visit     Vitamin D  deficiency   Essential hypertension   Obesity with starting BMI of 55.8   Prediabetes - Primary   Obesity Burnard Kitty, 37, is managing obesity with a focus on muscle mass maintenance and dietary control. She faces challenges with emotional eating and cravings, particularly around her menstrual cycle. Engages in physical activities like walking and plans to use a treadmill. Emphasized consistent physical activity and structured diet, avoiding sugary beverages, and focusing on protein intake. - Continue Category 4 nutrition plan with 120 grams of protein daily and 1800-2000 calories. Per day if eating off plan.  - Increase physical activity, including walking 20 minutes after dinner and using the treadmill. - Monitor weight and muscle mass regularly.  Emotional Eating On Zoloft  for emotional eating. Faces cravings around menstrual cycle but is managing diet and emotional triggers. Emphasized structured diet and avoiding emotional eating triggers. - Continue Zoloft  as prescribed. No refill needed this visit.  - Maintain structured diet plan and avoid emotional eating triggers.  Hypertension Hypertension managed with losartan -hydrochlorothiazide 100/25 mg daily. Blood pressure management is crucial due to obesity and prediabetes. Emphasized regular monitoring and medication adherence. -  Continue losartan -hydrochlorothiazide 100/25 mg daily. - Monitor blood pressure  regularly. Continue to work on nutrition plan to promote weight loss and improve BP control.    Prediabetes Lab Results  Component Value Date   HGBA1C 6.2 (H) 10/03/2022   Lab Results  Component Value Date   LDLCALC 97 10/03/2022   CREATININE 0.75 10/03/2022    Prediabetes management focuses on weight and dietary control to prevent diabetes progression. Emphasized reducing sugary beverages and increasing physical activity. - Continue dietary plan focusing on protein intake and reducing sugary beverages. - Increase physical activity as planned. Continue working on nutrition plan to decrease simple carbohydrates, increase lean proteins and exercise to promote weight loss, improve glycemic control and prevent progression to Type 2 diabetes.   Vitamin D  Deficiency On ergocalciferol  50,000 units weekly for vitamin D  deficiency. No issues with muscle weakness, nausea, or vomiting. Emphasized adherence to supplementation. - Continue ergocalciferol  50,000 units weekly. - Refill prescription at [facility] in [place].  Follow-up - Schedule follow-up appointment on Thursday, January 16th at 3 PM. - Schedule subsequent follow-up with Dr. [place] on Thursday, January 30th at 2:20 PM. No data recorded Anthropometric Measurements Height: 5' 8 (1.727 m) Weight at Last Visit: 336 lb Starting Weight: 366 lb Peak Weight: 366 lb   No data recorded Other Clinical Data Fasting: mo Labs: no Today's Visit #: 14 Starting Date: 10/03/22     ASSESSMENT AND PLAN:  Diet: Charday is currently in the action stage of change. As such, her goal is to continue with weight loss efforts. She has agreed to Category 4 Plan and keeping a food journal and adhering to recommended goals of 1800-2000 calories and 120+ grams of protein.  Exercise: Malkia has been instructed to continue exercising as is and try and increase walking frequency and time  for weight loss and overall health benefits.   Behavior  Modification:  We discussed the following Behavioral Modification Strategies today: increasing lean protein intake, decreasing simple carbohydrates, increasing vegetables, increase H2O intake, increase high fiber foods, meal planning and cooking strategies, better snacking choices, emotional eating strategies , avoiding temptations, planning for success, and keep a strict food journal. We discussed various medication options to help [place] with her weight loss efforts and we both agreed to continue Zoloft  for emotional eating/cravings and continue to work on nutritional and behavioral strategies to promote weight loss.  .  No follow-ups on file.SABRA She was informed of the importance of frequent follow up visits to maximize her success with intensive lifestyle modifications for her multiple health conditions.  Attestation Statements:   Reviewed by clinician on day of visit: allergies, medications, problem list, medical history, surgical history, family history, social history, and previous encounter notes.   Time spent on visit including pre-visit chart review and post-visit care and charting was 28 minutes.    Roselynne Lortz, PA-C

## 2023-06-26 ENCOUNTER — Ambulatory Visit (INDEPENDENT_AMBULATORY_CARE_PROVIDER_SITE_OTHER): Payer: No Typology Code available for payment source | Admitting: Physician Assistant

## 2023-07-04 ENCOUNTER — Ambulatory Visit (INDEPENDENT_AMBULATORY_CARE_PROVIDER_SITE_OTHER): Payer: No Typology Code available for payment source | Admitting: Physician Assistant

## 2023-07-09 ENCOUNTER — Telehealth (INDEPENDENT_AMBULATORY_CARE_PROVIDER_SITE_OTHER): Payer: No Typology Code available for payment source | Admitting: Psychology

## 2023-07-18 ENCOUNTER — Encounter (INDEPENDENT_AMBULATORY_CARE_PROVIDER_SITE_OTHER): Payer: Self-pay | Admitting: Family Medicine

## 2023-07-18 ENCOUNTER — Ambulatory Visit (INDEPENDENT_AMBULATORY_CARE_PROVIDER_SITE_OTHER): Payer: No Typology Code available for payment source | Admitting: Family Medicine

## 2023-07-18 VITALS — BP 134/102 | HR 90 | Temp 98.4°F | Ht 68.0 in | Wt 343.0 lb

## 2023-07-18 DIAGNOSIS — F339 Major depressive disorder, recurrent, unspecified: Secondary | ICD-10-CM | POA: Diagnosis not present

## 2023-07-18 DIAGNOSIS — Z6841 Body Mass Index (BMI) 40.0 and over, adult: Secondary | ICD-10-CM

## 2023-07-18 DIAGNOSIS — F39 Unspecified mood [affective] disorder: Secondary | ICD-10-CM | POA: Diagnosis not present

## 2023-07-18 DIAGNOSIS — I10 Essential (primary) hypertension: Secondary | ICD-10-CM

## 2023-07-18 DIAGNOSIS — E669 Obesity, unspecified: Secondary | ICD-10-CM | POA: Diagnosis not present

## 2023-07-18 MED ORDER — LOSARTAN POTASSIUM-HCTZ 100-25 MG PO TABS: 1.0000 | ORAL_TABLET | Freq: Every day | ORAL | 0 refills | Status: DC

## 2023-07-18 MED ORDER — SERTRALINE HCL 50 MG PO TABS: 75.0000 mg | ORAL_TABLET | Freq: Every day | ORAL | 0 refills | Status: DC

## 2023-07-18 NOTE — Progress Notes (Unsigned)
   SUBJECTIVE:  Chief Complaint: Obesity  Interim History: Patient here for follow up.  Last time she has seen me was in September.  She has struggled over the last few weeks with making time to exercise and adhering to meal plan.  She feels like she isn't with it.  Thinks she has eaten more sweets than she did previously.  Got into a habit of indulging in carbohydrates.  Has been staying up later to get stuff done after her kids are asleep.    Tabitha Golden is here to discuss her progress with her obesity treatment plan. She is on the keeping a food journal and adhering to recommended goals of 1800-2000 calories and 120 grams of protein and states she is following her eating plan approximately 90 % of the time. She states she is exercising 10 minutes 1 times per week.   OBJECTIVE: Visit Diagnoses: Problem List Items Addressed This Visit   None   Vitals Temp: 98.4 F (36.9 C) BP: (!) 167/100 Pulse Rate: 90 SpO2: 97 %   Anthropometric Measurements Height: 5\' 8"  (1.727 m) Weight: (!) 343 lb (155.6 kg) BMI (Calculated): 52.17 Weight at Last Visit: 340 lb Weight Lost Since Last Visit: 0 Weight Gained Since Last Visit: 3 Starting Weight: 366 lb Total Weight Loss (lbs): 23 lb (10.4 kg)   Body Composition  Body Fat %: 53.7 % Fat Mass (lbs): 184.4 lbs Muscle Mass (lbs): 151 lbs Total Body Water (lbs): 119 lbs Visceral Fat Rating : 19   Other Clinical Data Today's Visit #: 15 Starting Date: 10/03/22     ASSESSMENT AND PLAN:  Diet: Tabitha Golden is currently in the action stage of change. As such, her goal is to continue with weight loss efforts. She has agreed to Category 4 Plan 1800-2000 calories and 120 or more grams of protein daily.   Exercise: Tabitha Golden has been instructed that some exercise is better than none for weight loss and overall health benefits.   Behavior Modification:  We discussed the following Behavioral Modification Strategies today: increasing lean protein intake,  increasing vegetables, no skipping meals, meal planning and cooking strategies, and better snacking choices.   No follow-ups on file.Marland Kitchen She was informed of the importance of frequent follow up visits to maximize her success with intensive lifestyle modifications for her multiple health conditions.  Attestation Statements:   Reviewed by clinician on day of visit: allergies, medications, problem list, medical history, surgical history, family history, social history, and previous encounter notes.    Reuben Likes, MD

## 2023-07-18 NOTE — Assessment & Plan Note (Signed)
Patient on 50mg  sertraline daily.  Symptoms increasing and patient is feeling more overwhelmed and less motivated.  She denies SI and HI.  She has stress involving upcoming move and about her job.

## 2023-07-22 ENCOUNTER — Ambulatory Visit: Payer: No Typology Code available for payment source | Admitting: Family Medicine

## 2023-07-29 ENCOUNTER — Telehealth (INDEPENDENT_AMBULATORY_CARE_PROVIDER_SITE_OTHER): Payer: No Typology Code available for payment source | Admitting: Psychology

## 2023-07-29 DIAGNOSIS — F419 Anxiety disorder, unspecified: Secondary | ICD-10-CM

## 2023-07-29 DIAGNOSIS — F5089 Other specified eating disorder: Secondary | ICD-10-CM | POA: Diagnosis not present

## 2023-07-29 NOTE — Progress Notes (Signed)
  Office: [phone]  /  Fax: [phone]    Date: July 29, 2023  Appointment Start Time: 1:58pm Duration: 30 minutes Provider: Catherene Close, Psy.D. Type of Session: Individual Therapy  Location of Patient: Home (private location) Location of Provider: Provider's Home (private office) Type of Contact: Telepsychological Visit via MyChart Video Visit  Session Content: Manda is a 38 y.o. female presenting for a follow-up appointment to address the previously established treatment goal of increasing coping skills.Today's appointment was a telepsychological visit. Loetta Ringer provided verbal consent for today's telepsychological appointment and she is aware she is responsible for securing confidentiality on her end of the session. Prior to proceeding with today's appointment, Nasreen's physical location at the time of this appointment was obtained as well a phone number she could be reached at in the event of technical difficulties. Loetta Ringer and this provider participated in today's telepsychological service.   This provider conducted a brief check-in. Azaleigh shared about recent events, including an upcoming move due to her husband's new job and recent illness within the family. Reviewed recent eating habits. She acknowledged deviations and "fall[ing] back into old habits" due to ongoing stressors. Remainder of today's appointment focused on helping Olana get back on track. Psychoeducation provided regarding habit stacking. Explored current habits and habits she wants to implement as it relates to her eating. Focused on making "stacks" to help her meet her goals with the clinic. Overall, Tereka was receptive to today's appointment as evidenced by openness to sharing, responsiveness to feedback, and willingness to implement discussed strategies .  Mental Status Examination:  Appearance: neat Behavior: appropriate to circumstances Mood: neutral Affect: mood congruent Speech: WNL Eye Contact:  appropriate Psychomotor Activity: WNL Gait: unable to assess Thought Process: linear, logical, and goal directed and no evidence or endorsement of suicidal, homicidal, and self-harm ideation, plan and intent  Thought Content/Perception: no hallucinations, delusions, bizarre thinking or behavior endorsed or observed Orientation: AAOx4 Memory/Concentration: intact Insight: good Judgment: good  Interventions:  Conducted a brief chart review Provided empathic reflections and validation Provided positive reinforcement Employed supportive psychotherapy interventions to facilitate reduced distress and to improve coping skills with identified stressors Engaged patient in problem solving Psychoeducation provided regarding habit stacking   DSM-5 Diagnosis(es):  F50.89 Other Specified Feeding or Eating Disorder, Emotional Eating Behaviors and F41.9 Unspecified Anxiety Disorder  Treatment Goal & Progress: During the initial appointment with this provider, the following treatment goal was established: increase coping skills. Bellamie has demonstrated progress in her goal as evidenced by increased awareness of hunger patterns, increased awareness of triggers for emotional eating behaviors, and reduction in emotional eating behaviors . Josely also continues to demonstrate willingness to engage in learned skill(s).  Plan: The next appointment is scheduled for 08/19/2023 at 2:30pm, which will be via MyChart Video Visit. The next session will focus on working towards the established treatment goal.   Catherene Close, PsyD

## 2023-07-29 NOTE — Assessment & Plan Note (Signed)
 Blood pressure elevated today. She is on losartan  hydrochlorothiazide combination medication.  She is stressed today with concerns about her job with their move.  Will refill medication and follow up on BP at next visit.  No chest pain, chest pressure, and headache.

## 2023-08-15 ENCOUNTER — Encounter (INDEPENDENT_AMBULATORY_CARE_PROVIDER_SITE_OTHER): Payer: Self-pay | Admitting: Family Medicine

## 2023-08-15 ENCOUNTER — Ambulatory Visit (INDEPENDENT_AMBULATORY_CARE_PROVIDER_SITE_OTHER): Payer: No Typology Code available for payment source | Admitting: Family Medicine

## 2023-08-15 VITALS — BP 144/98 | HR 64 | Temp 98.0°F | Ht 68.0 in | Wt 335.0 lb

## 2023-08-15 DIAGNOSIS — Z6841 Body Mass Index (BMI) 40.0 and over, adult: Secondary | ICD-10-CM

## 2023-08-15 DIAGNOSIS — E559 Vitamin D deficiency, unspecified: Secondary | ICD-10-CM

## 2023-08-15 DIAGNOSIS — I10 Essential (primary) hypertension: Secondary | ICD-10-CM

## 2023-08-15 DIAGNOSIS — R7303 Prediabetes: Secondary | ICD-10-CM

## 2023-08-15 DIAGNOSIS — D508 Other iron deficiency anemias: Secondary | ICD-10-CM

## 2023-08-15 DIAGNOSIS — E669 Obesity, unspecified: Secondary | ICD-10-CM

## 2023-08-15 DIAGNOSIS — D6489 Other specified anemias: Secondary | ICD-10-CM | POA: Diagnosis not present

## 2023-08-15 DIAGNOSIS — E785 Hyperlipidemia, unspecified: Secondary | ICD-10-CM

## 2023-08-15 MED ORDER — LOSARTAN POTASSIUM-HCTZ 100-25 MG PO TABS: 1.0000 | ORAL_TABLET | Freq: Every day | ORAL | 0 refills | Status: DC

## 2023-08-15 MED ORDER — VITAMIN D (ERGOCALCIFEROL) 1.25 MG (50000 UNIT) PO CAPS: 50000.0000 [IU] | ORAL_CAPSULE | ORAL | 0 refills | Status: DC

## 2023-08-15 MED ORDER — FUSION PLUS PO CAPS: 1.0000 | ORAL_CAPSULE | Freq: Every day | ORAL | 0 refills | Status: DC

## 2023-08-15 NOTE — Progress Notes (Signed)
 SUBJECTIVE:  Chief Complaint: Obesity  Interim History: Patient voices she is still stressed about her moving and doing repairs to the house.  She has been making more of an effort to go to bed earlier at night.  Plan is to be in a new house by August.  Goal is to get the house on the market in the next few weeks. Will likely be moving 2 hours away.  Has been doing more activity around the house.  She hasn't done much exercise wise per say.  She has gotten back on plan food wise and is able to get all the food in consistently.   Tabitha Golden is here to discuss her progress with her obesity treatment plan. She is on the Category 4 and states she is following her eating plan approximately 90 % of the time. She states she is doing home renovations.   OBJECTIVE: Visit Diagnoses: Problem List Items Addressed This Visit       Cardiovascular and Mediastinum   Essential hypertension - Primary   Blood pressure elevated again today.  She needs a refill of her medications today.      Relevant Medications   losartan-hydrochlorothiazide (HYZAAR) 100-25 MG tablet     Other   Vitamin D deficiency   Doing well on prescription strength Vitamin D.  Needs a refill of vitamin d and a lab draw to assess Vitamin D level today.      Relevant Medications   Vitamin D, Ergocalciferol, (DRISDOL) 1.25 MG (50000 UNIT) CAPS capsule   Other Relevant Orders   VITAMIN D 25 Hydroxy (Vit-D Deficiency, Fractures) (Completed)   Absolute anemia   Patient has been taking fusion plus to help with her anemia. Needs repeat labs today of cbc and anemia panel.  Planning to discuss results at next appointment.      Relevant Medications   Iron-FA-B Cmp-C-Biot-Probiotic (FUSION PLUS) CAPS   Other Relevant Orders   Anemia panel (Completed)   CBC w/Diff/Platelet (Completed)   Obesity with starting BMI of 55.8   Anthropometric Measurements Height: 5\' 8"  (1.727 m) Weight: (!) 335 lb (152 kg) BMI (Calculated): 50.95 Weight at  Last Visit: 343 lb Weight Lost Since Last Visit: 8 Weight Gained Since Last Visit: 0 Starting Weight: 366 lb Total Weight Loss (lbs): 31 lb (14.1 kg) Peak Weight: 366 lb  Body Composition  Body Fat %: 51.8 % Fat Mass (lbs): 173.8 lbs Muscle Mass (lbs): 153.4 lbs Total Body Water (lbs): 113.2 lbs Visceral Fat Rating : 18  Other Clinical Data Today's Visit #: 16 Starting Date: 10/03/22 Comments: Cat 4         Prediabetes   Last A1c and Insulin not within goal.  Has been trying to work on macronutrient intake and calorie range goal.  Needs repeat labs today of A1c and Insulin.      Relevant Orders   Comprehensive metabolic panel (Completed)   Hemoglobin A1c (Completed)   Insulin, random (Completed)   Dyslipidemia   Last HDL was low with borderline LDL.  Patient has been working on dietary modifications to help with cholesterol and doing more consistent activity to help with HDL.  Repeat FLP today.      Relevant Orders   Lipid Panel With LDL/HDL Ratio (Completed)    No data recorded       ASSESSMENT AND PLAN:  Diet: August is currently in the action stage of change. As such, her goal is to continue with weight loss efforts and has agreed  to the Category 4 Plan.   Exercise:  For substantial health benefits, adults should do at least 150 minutes (2 hours and 30 minutes) a week of moderate-intensity, or 75 minutes (1 hour and 15 minutes) a week of vigorous-intensity aerobic physical activity, or an equivalent combination of moderate- and vigorous-intensity aerobic activity. Aerobic activity should be performed in episodes of at least 10 minutes, and preferably, it should be spread throughout the week.  Behavior Modification:  We discussed the following Behavioral Modification Strategies today: increasing lean protein intake, decreasing simple carbohydrates, keeping healthy foods in the home, better snacking choices, and avoiding temptations.  No follow-ups on file.Marland Kitchen  She was informed of the importance of frequent follow up visits to maximize her success with intensive lifestyle modifications for her multiple health conditions.  Attestation Statements:   Reviewed by clinician on day of visit: allergies, medications, problem list, medical history, surgical history, family history, social history, and previous encounter notes.     Reuben Likes, MD

## 2023-08-16 LAB — HEMOGLOBIN A1C
Est. average glucose Bld gHb Est-mCnc: 120 mg/dL
Hgb A1c MFr Bld: 5.8 % — ABNORMAL HIGH (ref 4.8–5.6)

## 2023-08-16 LAB — CBC WITH DIFFERENTIAL/PLATELET
Basophils Absolute: 0 10*3/uL (ref 0.0–0.2)
Basos: 0 %
EOS (ABSOLUTE): 0.1 10*3/uL (ref 0.0–0.4)
Eos: 1 %
Hemoglobin: 12.8 g/dL (ref 11.1–15.9)
Immature Grans (Abs): 0.1 10*3/uL (ref 0.0–0.1)
Immature Granulocytes: 1 %
Lymphocytes Absolute: 1.5 10*3/uL (ref 0.7–3.1)
Lymphs: 15 %
MCH: 26.3 pg — ABNORMAL LOW (ref 26.6–33.0)
MCHC: 32.3 g/dL (ref 31.5–35.7)
MCV: 82 fL (ref 79–97)
Monocytes Absolute: 0.4 10*3/uL (ref 0.1–0.9)
Monocytes: 4 %
Neutrophils Absolute: 7.8 10*3/uL — ABNORMAL HIGH (ref 1.4–7.0)
Neutrophils: 79 %
Platelets: 325 10*3/uL (ref 150–450)
RBC: 4.86 x10E6/uL (ref 3.77–5.28)
RDW: 14.4 % (ref 11.7–15.4)
WBC: 9.9 10*3/uL (ref 3.4–10.8)

## 2023-08-16 LAB — COMPREHENSIVE METABOLIC PANEL
ALT: 18 IU/L (ref 0–32)
AST: 17 IU/L (ref 0–40)
Albumin: 4 g/dL (ref 3.9–4.9)
Alkaline Phosphatase: 96 IU/L (ref 44–121)
BUN/Creatinine Ratio: 17 (ref 9–23)
BUN: 13 mg/dL (ref 6–20)
Bilirubin Total: 0.4 mg/dL (ref 0.0–1.2)
CO2: 23 mmol/L (ref 20–29)
Calcium: 8.6 mg/dL — ABNORMAL LOW (ref 8.7–10.2)
Chloride: 104 mmol/L (ref 96–106)
Creatinine, Ser: 0.75 mg/dL (ref 0.57–1.00)
Globulin, Total: 2.4 g/dL (ref 1.5–4.5)
Glucose: 99 mg/dL (ref 70–99)
Potassium: 3.6 mmol/L (ref 3.5–5.2)
Sodium: 141 mmol/L (ref 134–144)
Total Protein: 6.4 g/dL (ref 6.0–8.5)
eGFR: 104 mL/min/{1.73_m2} (ref >59)

## 2023-08-16 LAB — ANEMIA PANEL
Ferritin: 36 ng/mL (ref 15–150)
Folate, Hemolysate: 377 ng/mL
Folate, RBC: 952 ng/mL (ref >498)
Hematocrit: 39.6 % (ref 34.0–46.6)
Iron Saturation: 36 % (ref 15–55)
Iron: 114 ug/dL (ref 27–159)
Retic Ct Pct: 1.8 % (ref 0.6–2.6)
Total Iron Binding Capacity: 316 ug/dL (ref 250–450)
UIBC: 202 ug/dL (ref 131–425)
Vitamin B-12: 687 pg/mL (ref 232–1245)

## 2023-08-16 LAB — LIPID PANEL WITH LDL/HDL RATIO
Cholesterol, Total: 159 mg/dL (ref 100–199)
HDL: 36 mg/dL — ABNORMAL LOW (ref >39)
LDL Chol Calc (NIH): 98 mg/dL (ref 0–99)
LDL/HDL Ratio: 2.7 ratio (ref 0.0–3.2)
Triglycerides: 138 mg/dL (ref 0–149)
VLDL Cholesterol Cal: 25 mg/dL (ref 5–40)

## 2023-08-16 LAB — VITAMIN D 25 HYDROXY (VIT D DEFICIENCY, FRACTURES): Vit D, 25-Hydroxy: 41.5 ng/mL (ref 30.0–100.0)

## 2023-08-16 LAB — INSULIN, RANDOM: INSULIN: 29.2 u[IU]/mL — ABNORMAL HIGH (ref 2.6–24.9)

## 2023-08-19 ENCOUNTER — Telehealth (INDEPENDENT_AMBULATORY_CARE_PROVIDER_SITE_OTHER): Payer: No Typology Code available for payment source | Admitting: Psychology

## 2023-08-19 DIAGNOSIS — F419 Anxiety disorder, unspecified: Secondary | ICD-10-CM

## 2023-08-19 DIAGNOSIS — F5089 Other specified eating disorder: Secondary | ICD-10-CM | POA: Diagnosis not present

## 2023-08-19 NOTE — Progress Notes (Signed)
  Office: [phone]  /  Fax: [phone]    Date: August 19, 2023  Appointment Start Time: 2:42pm Duration: 26 minutes Provider: Lawerance Cruel, Psy.D. Type of Session: Individual Therapy  Location of Patient: Home (private location) Location of Provider: Provider's Home (private office) Type of Contact: Telepsychological Visit via MyChart Video Visit  Session Content: Tabitha Golden is a 38 y.o. female presenting for a follow-up appointment to address the previously established treatment goal of increasing coping skills.Today's appointment was a telepsychological visit. Tabitha Golden provided verbal consent for today's telepsychological appointment and she is aware she is responsible for securing confidentiality on her end of the session. Prior to proceeding with today's appointment, Tabitha Golden's physical location at the time of this appointment was obtained as well a phone number she could be reached at in the event of technical difficulties. Tabitha Golden and this provider participated in today's telepsychological service.   This provider conducted a brief check-in. Tabitha Golden shared, "Things have been pretty good, I guess." Further explored and processed. She discussed she is focusing on the upcoming move, noting "It's still a lot." Discussed reality vs. expectation. Remainder of today's appointment focused on discussing the impact of stress related to the move on eating habits. Despite ongoing stressors, she continues to report a reduction in engagement in emotional eating behaviors. Reviewed habit stacks. She shared examples of implementing previously established stacks and agreed continuing her stacks. Overall, Tabitha Golden was receptive to today's appointment as evidenced by openness to sharing, responsiveness to feedback, and willingness to continue engaging in learned skills.  Mental Status Examination:  Appearance: neat Behavior: appropriate to circumstances Mood: neutral Affect: mood congruent Speech: WNL Eye Contact:  appropriate Psychomotor Activity: WNL Gait: unable to assess Thought Process: linear, logical, and goal directed and no evidence or endorsement of suicidal, homicidal, and self-harm ideation, plan and intent  Thought Content/Perception: no hallucinations, delusions, bizarre thinking or behavior endorsed or observed Orientation: AAOx4 Memory/Concentration: intact Insight: good Judgment: good  Interventions:  Conducted a brief chart review Provided empathic reflections and validation Reviewed content from the previous session Provided positive reinforcement Employed supportive psychotherapy interventions to facilitate reduced distress and to improve coping skills with identified stressors  DSM-5 Diagnosis(es):  F50.89 Other Specified Feeding or Eating Disorder, Emotional Eating Behaviors and F41.9 Unspecified Anxiety Disorder  Treatment Goal & Progress: During the initial appointment with this provider, the following treatment goal was established: increase coping skills. Tabitha Golden has demonstrated progress in her goal as evidenced by increased awareness of hunger patterns, increased awareness of triggers for emotional eating behaviors, and reduction in emotional eating behaviors . Tabitha Golden also continues to demonstrate willingness to engage in learned skill(s).  Plan: The next appointment is scheduled for 09/16/2023 at 2:30pm, which will be via MyChart Video Visit. The next session will focus on working towards the established treatment goal.   Lawerance Cruel, PsyD

## 2023-08-23 ENCOUNTER — Ambulatory Visit: Payer: No Typology Code available for payment source | Admitting: Family Medicine

## 2023-08-26 DIAGNOSIS — E785 Hyperlipidemia, unspecified: Secondary | ICD-10-CM | POA: Insufficient documentation

## 2023-08-26 NOTE — Assessment & Plan Note (Signed)
 Doing well on prescription strength Vitamin D.  Needs a refill of vitamin d and a lab draw to assess Vitamin D level today.

## 2023-08-26 NOTE — Assessment & Plan Note (Signed)
 Anthropometric Measurements Height: 5\' 8"  (1.727 m) Weight: (!) 335 lb (152 kg) BMI (Calculated): 50.95 Weight at Last Visit: 343 lb Weight Lost Since Last Visit: 8 Weight Gained Since Last Visit: 0 Starting Weight: 366 lb Total Weight Loss (lbs): 31 lb (14.1 kg) Peak Weight: 366 lb  Body Composition  Body Fat %: 51.8 % Fat Mass (lbs): 173.8 lbs Muscle Mass (lbs): 153.4 lbs Total Body Water (lbs): 113.2 lbs Visceral Fat Rating : 18  Other Clinical Data Today's Visit #: 16 Starting Date: 10/03/22 Comments: Cat 4

## 2023-08-26 NOTE — Assessment & Plan Note (Signed)
 Patient has been taking fusion plus to help with her anemia. Needs repeat labs today of cbc and anemia panel.  Planning to discuss results at next appointment.

## 2023-08-26 NOTE — Assessment & Plan Note (Signed)
 Last A1c and Insulin not within goal.  Has been trying to work on macronutrient intake and calorie range goal.  Needs repeat labs today of A1c and Insulin.

## 2023-08-26 NOTE — Assessment & Plan Note (Signed)
 Last HDL was low with borderline LDL.  Patient has been working on dietary modifications to help with cholesterol and doing more consistent activity to help with HDL.  Repeat FLP today.

## 2023-08-26 NOTE — Assessment & Plan Note (Signed)
 Blood pressure elevated again today.  She needs a refill of her medications today.

## 2023-09-04 ENCOUNTER — Other Ambulatory Visit (INDEPENDENT_AMBULATORY_CARE_PROVIDER_SITE_OTHER): Payer: Self-pay | Admitting: Family Medicine

## 2023-09-04 DIAGNOSIS — E559 Vitamin D deficiency, unspecified: Secondary | ICD-10-CM

## 2023-09-10 ENCOUNTER — Ambulatory Visit (INDEPENDENT_AMBULATORY_CARE_PROVIDER_SITE_OTHER): Payer: No Typology Code available for payment source | Admitting: Family Medicine

## 2023-09-10 ENCOUNTER — Encounter (INDEPENDENT_AMBULATORY_CARE_PROVIDER_SITE_OTHER): Payer: Self-pay | Admitting: Family Medicine

## 2023-09-10 VITALS — BP 133/85 | HR 84 | Temp 98.4°F | Ht 68.0 in | Wt 340.0 lb

## 2023-09-10 DIAGNOSIS — R632 Polyphagia: Secondary | ICD-10-CM | POA: Diagnosis not present

## 2023-09-10 DIAGNOSIS — E559 Vitamin D deficiency, unspecified: Secondary | ICD-10-CM | POA: Diagnosis not present

## 2023-09-10 DIAGNOSIS — F339 Major depressive disorder, recurrent, unspecified: Secondary | ICD-10-CM

## 2023-09-10 DIAGNOSIS — E669 Obesity, unspecified: Secondary | ICD-10-CM | POA: Diagnosis not present

## 2023-09-10 DIAGNOSIS — Z6841 Body Mass Index (BMI) 40.0 and over, adult: Secondary | ICD-10-CM

## 2023-09-10 MED ORDER — VITAMIN D (ERGOCALCIFEROL) 1.25 MG (50000 UNIT) PO CAPS: 50000.0000 [IU] | ORAL_CAPSULE | ORAL | 0 refills | Status: DC

## 2023-09-10 MED ORDER — SERTRALINE HCL 50 MG PO TABS: 75.0000 mg | ORAL_TABLET | Freq: Every day | ORAL | 0 refills | Status: DC

## 2023-09-10 NOTE — Progress Notes (Signed)
 SUBJECTIVE:  Chief Complaint: Obesity  Interim History: patient returns for 1 month follow up.  She is planning on listing her house in the next week or two but mentions there is still quite a bit of stuff that needs to be done.  She is still under a lot of stress. Most of her free time is dedicated to home repairs and renovations.  She realizes she has not gone to the grocery store as frequently and therefore food is not as stocked as it was previously.   Kaysea is here to discuss her progress with her obesity treatment plan. She is on the Category 4 Plan and states she is following her eating plan approximately 90 % of the time. She states she is not exercising.   OBJECTIVE: Visit Diagnoses: Problem List Items Addressed This Visit       Other   Vitamin D deficiency   Discussed lab result from last appointment.  Patient is still not at goal- 3 months rx vitamin d sent in today.      Relevant Medications   Vitamin D, Ergocalciferol, (DRISDOL) 1.25 MG (50000 UNIT) CAPS capsule   Obesity with starting BMI of 55.8   BMI 50.0-59.9, adult (HCC) Current BMI 52.6   Depression, recurrent (HCC) - Primary   Patient reports continued stress with home renovations in anticipation for getting her house on the market to sell.  She is needing a refill of her sertraline.      Relevant Medications   sertraline (ZOLOFT) 50 MG tablet   Polyphagia   Medication options were discussed extensively with patient today.  Given availability, cost, insurance coverage and other medical comorbidities the decision was reached to start medications Lomaira and Topiramate.  These medications will be used as a substitute for brand name Qsymia in doses that are synanomous.  Patient understands this is an off label usage.  We discussed the titration schedule with the goal of 5% weight loss at 3 months at a treatment dose.  The first two weeks will be a starting dose of 25mg  of Topiramate and 4mg  of Lomaira.  After two  weeks the patient will increase to 50mg  of Topiramate and 8mg  of Lomaira and will stay on this dose until the next appointment.  Controlled substance contract was discussed and signed today.         No data recorded No data recorded No data recorded No data recorded     09/10/2023    2:00 PM 08/15/2023    9:50 AM 08/15/2023    9:00 AM  Vitals with BMI  Height 5\' 8"   5\' 8"   Weight 340 lbs  335 lbs  BMI 51.71  50.95  Systolic 133 144 161  Diastolic 85 98 110  Pulse 84  64    ASSESSMENT AND PLAN:  Diet: Alyanna is currently in the action stage of change. As such, her goal is to continue with weight loss efforts and has agreed to the Category 4 Plan.   Exercise:  For substantial health benefits, adults should do at least 150 minutes (2 hours and 30 minutes) a week of moderate-intensity, or 75 minutes (1 hour and 15 minutes) a week of vigorous-intensity aerobic physical activity, or an equivalent combination of moderate- and vigorous-intensity aerobic activity. Aerobic activity should be performed in episodes of at least 10 minutes, and preferably, it should be spread throughout the week.  Behavior Modification:  We discussed the following Behavioral Modification Strategies today: increasing lean protein intake, increasing  vegetables, meal planning and cooking strategies, keeping healthy foods in the home, and planning for success. We discussed various medication options to help [name] with her weight loss efforts and we both agreed to start lomaira 4mg  and topiramate 25mg  daily.  No follow-ups on file.Marland Kitchen She was informed of the importance of frequent follow up visits to maximize her success with intensive lifestyle modifications for her multiple health conditions.  Attestation Statements:   Reviewed by clinician on day of visit: allergies, medications, problem list, medical history, surgical history, family history, social history, and previous encounter notes.    Reuben Likes,  MD

## 2023-09-10 NOTE — Assessment & Plan Note (Addendum)
 Medication options were discussed extensively with patient today.  Given availability, cost, insurance coverage and other medical comorbidities the decision was reached to start medications Lomaira and Topiramate.  These medications will be used as a substitute for brand name Qsymia in doses that are synanomous.  Patient understands this is an off label usage.  We discussed the titration schedule with the goal of 5% weight loss at 3 months at a treatment dose.  The first two weeks will be a starting dose of 25mg  of Topiramate and 4mg  of Lomaira.  After two weeks the patient will increase to 50mg  of Topiramate and 8mg  of Lomaira and will stay on this dose until the next appointment.  Controlled substance contract was discussed and signed today.

## 2023-09-16 ENCOUNTER — Telehealth (INDEPENDENT_AMBULATORY_CARE_PROVIDER_SITE_OTHER): Admitting: Psychology

## 2023-09-16 DIAGNOSIS — F419 Anxiety disorder, unspecified: Secondary | ICD-10-CM

## 2023-09-16 DIAGNOSIS — F5089 Other specified eating disorder: Secondary | ICD-10-CM | POA: Diagnosis not present

## 2023-09-16 NOTE — Progress Notes (Signed)
  Office: [phone]  /  Fax: [phone]    Date: September 16, 2023  Appointment Start Time: 2:31pm Duration: 19 minutes Provider: Lawerance Cruel, Psy.D. Type of Session: Individual Therapy  Location of Patient: Home (private location) Location of Provider: Provider's Home (private office) Type of Contact: Telepsychological Visit via MyChart Video Visit  Session Content: Tabitha Golden is a 38 y.o. female presenting for a follow-up appointment to address the previously established treatment goal of increasing coping skills.Today's appointment was a telepsychological visit. [name] provided verbal consent for today's telepsychological appointment and she is aware she is responsible for securing confidentiality on her end of the session. Prior to proceeding with today's appointment, [name]'s physical location at the time of this appointment was obtained as well a phone number she could be reached at in the event of technical difficulties. [name] and this provider participated in today's telepsychological service.   This provider conducted a brief check-in. Arlo shared they continue to work on her home to put on the market. She described her eating habits as "okay," noting changes in her routine. Blenda was engaged in problem solving to help her resume eating congruent to her prescribed structured meal plan. She agreed to leaving lunch/snacks at work for the week; making hard boiled eggs in bulk; doubling recipes when possible; purchasing frozen vegetables to cook in the microwave; and going grocery shopping on Mondays. Overall, [name] was receptive to today's appointment as evidenced by openness to sharing, responsiveness to feedback, and willingness to implement discussed strategies .  Mental Status Examination:  Appearance: neat Behavior: appropriate to circumstances Mood: neutral Affect: mood congruent Speech: WNL Eye Contact: appropriate Psychomotor Activity: WNL Gait: unable to assess Thought Process:  linear, logical, and goal directed and no evidence or endorsement of suicidal, homicidal, and self-harm ideation, plan and intent  Thought Content/Perception: no hallucinations, delusions, bizarre thinking or behavior endorsed or observed Orientation: AAOx4 Memory/Concentration: intact Insight: good Judgment: good  Interventions:  Conducted a brief chart review Provided empathic reflections and validation Reviewed content from the previous session Provided positive reinforcement Employed supportive psychotherapy interventions to facilitate reduced distress and to improve coping skills with identified stressors Engaged patient in problem solving  DSM-5 Diagnosis(es):  F50.89 Other Specified Feeding or Eating Disorder, Emotional Eating Behaviors and F41.9 Unspecified Anxiety Disorder  Treatment Goal & Progress: During the initial appointment with this provider, the following treatment goal was established: increase coping skills. [name] has demonstrated progress in her goal as evidenced by increased awareness of hunger patterns, increased awareness of triggers for emotional eating behaviors, and reduction in emotional eating behaviors. [name] also continues to demonstrate willingness to engage in learned skill(s).   Plan: The next appointment is scheduled for 10/14/2023 at 11:30am, which will be via MyChart Video Visit. The next session will focus on working towards the established treatment goal.   Lawerance Cruel, PsyD

## 2023-09-17 NOTE — Assessment & Plan Note (Signed)
 Discussed lab result from last appointment.  Patient is still not at goal- 3 months rx vitamin d sent in today.

## 2023-09-17 NOTE — Assessment & Plan Note (Signed)
 Patient reports continued stress with home renovations in anticipation for getting her house on the market to sell.  She is needing a refill of her sertraline.

## 2023-10-14 ENCOUNTER — Telehealth (INDEPENDENT_AMBULATORY_CARE_PROVIDER_SITE_OTHER): Admitting: Psychology

## 2023-10-14 DIAGNOSIS — F5089 Other specified eating disorder: Secondary | ICD-10-CM | POA: Diagnosis not present

## 2023-10-14 DIAGNOSIS — F419 Anxiety disorder, unspecified: Secondary | ICD-10-CM

## 2023-10-14 NOTE — Progress Notes (Signed)
  Office: [phone]  /  Fax: [phone]    Date: October 14, 2023  Appointment Start Time: 11:30am Duration: 28 minutes Provider: Catherene Close, Psy.D. Type of Session: Individual Therapy  Location of Patient: Home (private location) Location of Provider: Provider's Home (private office) Type of Contact: Telepsychological Visit via MyChart Video Visit  Session Content: [name] is a 38 y.o. female presenting for a follow-up appointment to address the previously established treatment goal of increasing coping skills.Today's appointment was a telepsychological visit. Tabitha Golden provided verbal consent for today's telepsychological appointment and she is aware she is responsible for securing confidentiality on her end of the session. Prior to proceeding with today's appointment, Tabitha Golden's physical location at the time of this appointment was obtained as well a phone number she could be reached at in the event of technical difficulties. Tabitha Golden and this provider participated in today's telepsychological service.   This provider conducted a brief check-in. Tabitha Golden stated she listed her home and discussed future plans. Explored recent eating habits. Tabitha Golden indicated "old eating habits have snuck back in" due to feeling overwhelmed. Given the recent stressors and eating habits, psychoeducation provided regarding self-compassion. Tabitha Golden was engaged in a self-compassion exercise to help with eating-related challenges and other ongoing stressors. She was encouraged to regularly ask herself, "What do I need right now?" and "How can I comfort and care for myself in this moment?" Overall, Tabitha Golden was receptive to today's appointment as evidenced by openness to sharing, responsiveness to feedback, and willingness to work toward increasing self-compassion.  Mental Status Examination:  Appearance: neat Behavior: appropriate to circumstances Mood: neutral Affect: mood congruent Speech: WNL Eye Contact:  appropriate Psychomotor Activity: WNL Gait: unable to assess Thought Process: linear, logical, and goal directed and no evidence or endorsement of suicidal, homicidal, and self-harm ideation, plan and intent  Thought Content/Perception: no hallucinations, delusions, bizarre thinking or behavior endorsed or observed Orientation: AAOx4 Memory/Concentration: intact Insight: good Judgment: good  Interventions:  Conducted a brief chart review Provided empathic reflections and validation Provided positive reinforcement Employed supportive psychotherapy interventions to facilitate reduced distress and to improve coping skills with identified stressors Psychoeducation provided regarding self-compassion Engaged pt in a self-compassion exercise  DSM-5 Diagnosis(es):  F50.89 Other Specified Feeding or Eating Disorder, Emotional Eating Behaviors and F41.9 Unspecified Anxiety Disorder  Treatment Goal & Progress: During the initial appointment with this provider, the following treatment goal was established: increase coping skills. Tabitha Golden has demonstrated progress in her goal as evidenced by increased awareness of hunger patterns, increased awareness of triggers for emotional eating behaviors, and reduction in emotional eating behaviors. Tabitha Golden also continues to demonstrate willingness to engage in learned skill(s).   Plan: The next appointment is scheduled for 10/29/2023 at 11:30am, which will be via MyChart Video Visit. The next session will focus on working towards the established treatment goal.   Catherene Close, PsyD

## 2023-10-15 ENCOUNTER — Ambulatory Visit (INDEPENDENT_AMBULATORY_CARE_PROVIDER_SITE_OTHER): Admitting: Family Medicine

## 2023-10-21 ENCOUNTER — Telehealth (INDEPENDENT_AMBULATORY_CARE_PROVIDER_SITE_OTHER): Admitting: Psychology

## 2023-10-29 ENCOUNTER — Telehealth (INDEPENDENT_AMBULATORY_CARE_PROVIDER_SITE_OTHER): Admitting: Psychology

## 2023-10-29 DIAGNOSIS — F419 Anxiety disorder, unspecified: Secondary | ICD-10-CM

## 2023-10-29 DIAGNOSIS — F5089 Other specified eating disorder: Secondary | ICD-10-CM

## 2023-10-29 NOTE — Progress Notes (Signed)
  Office: [phone]  /  Fax: [phone]    Date: Oct 29, 2023  Golden Start Time: 11:42am Duration: 23 minutes Provider: Catherene Close, Psy.D. Type of Session: Individual Therapy  Location of Patient: Home (private location) Location of Provider: Provider's Home (private office) Type of Contact: Telepsychological Visit via MyChart Video Visit  Session Content: Tabitha Golden is a 38 y.o. female presenting for a follow-up Golden to address the previously established treatment goal of increasing coping skills. Today's Golden was a telepsychological visit. Tabitha Golden provided verbal consent for today's telepsychological Golden and she is aware she is responsible for securing confidentiality on her end of the session. Prior to proceeding with today's Golden, Tabitha Golden's physical location at the time of this Golden was obtained as well a phone number she could be reached at in the event of technical difficulties. Tabitha Golden and this provider participated in today's telepsychological service.   This provider conducted a brief check-in. Tabitha Golden shared updates regarding her upcoming move. Reviewed recent eating habits. Tabitha Golden discussed being more mindful of her eating habits since the last Golden with this provider and provided examples of making better choices and engaging in portion control. Additionally, Tabitha Golden discussed checking-in with herself to help cultivate self-compassion. This provider and [name] also explored the story her mind tells her about her weight loss/eating habits that is stuck on repeat. Tabitha Golden focused on re-writing a more compassionate version, which she will complete for homework. Overall, Tabitha Golden was receptive to today's Golden as evidenced by openness to sharing, responsiveness to feedback, and willingness to work toward increasing self-compassion.  Mental Status Examination:  Appearance: neat Behavior: appropriate to circumstances Mood:  neutral Affect: mood congruent Speech: WNL Eye Contact: appropriate Psychomotor Activity: WNL Gait: unable to assess Thought Process: linear, logical, and goal directed and no evidence or endorsement of suicidal, homicidal, and self-harm ideation, plan and intent  Thought Content/Perception: no hallucinations, delusions, bizarre thinking or behavior endorsed or observed Orientation: AAOx4 Memory/Concentration: intact Insight: good Judgment: good  Interventions:  Conducted a brief chart review Provided empathic reflections and validation Reviewed content from the previous session Provided positive reinforcement Employed supportive psychotherapy interventions to facilitate reduced distress and to improve coping skills with identified stressors Engaged pt in a self-compassion exercise  DSM-5 Diagnosis(es): F50.89 Other Specified Feeding or Eating Disorder, Emotional Eating Behaviors and F41.9 Unspecified Anxiety Disorder  Treatment Goal & Progress: During the initial Golden with this provider, the following treatment goal was established: increase coping skills. Tabitha Golden has demonstrated progress in her goal as evidenced by increased awareness of hunger patterns, increased awareness of triggers for emotional eating behaviors, and reduction in emotional eating behaviors. Tabitha Golden also continues to demonstrate willingness to engage in learned skill(s).   Plan: The next Golden is scheduled for 12/02/2023 at 9:30am, which will be via MyChart Video Visit. The next session will focus on working towards the established treatment goal.      Catherene Close, PsyD

## 2023-11-05 ENCOUNTER — Encounter (INDEPENDENT_AMBULATORY_CARE_PROVIDER_SITE_OTHER): Payer: Self-pay | Admitting: Adult Health

## 2023-11-05 ENCOUNTER — Ambulatory Visit (INDEPENDENT_AMBULATORY_CARE_PROVIDER_SITE_OTHER): Admitting: Adult Health

## 2023-11-05 VITALS — BP 133/77 | HR 67 | Temp 98.3°F | Ht 68.0 in | Wt 341.0 lb

## 2023-11-05 DIAGNOSIS — I10 Essential (primary) hypertension: Secondary | ICD-10-CM

## 2023-11-05 DIAGNOSIS — E559 Vitamin D deficiency, unspecified: Secondary | ICD-10-CM

## 2023-11-05 DIAGNOSIS — E669 Obesity, unspecified: Secondary | ICD-10-CM

## 2023-11-05 DIAGNOSIS — R7303 Prediabetes: Secondary | ICD-10-CM | POA: Diagnosis not present

## 2023-11-05 DIAGNOSIS — Z6841 Body Mass Index (BMI) 40.0 and over, adult: Secondary | ICD-10-CM

## 2023-11-05 DIAGNOSIS — R632 Polyphagia: Secondary | ICD-10-CM | POA: Diagnosis not present

## 2023-11-05 MED ORDER — METFORMIN HCL ER 500 MG PO TB24: 500.0000 mg | ORAL_TABLET | Freq: Every day | ORAL | 0 refills | Status: DC

## 2023-11-05 MED ORDER — ZEPBOUND 2.5 MG/0.5ML ~~LOC~~ SOAJ
2.5000 mg | SUBCUTANEOUS | 0 refills | Status: DC
Start: 2023-11-05 — End: 2024-01-02

## 2023-11-05 MED ORDER — VITAMIN D (ERGOCALCIFEROL) 1.25 MG (50000 UNIT) PO CAPS: 50000.0000 [IU] | ORAL_CAPSULE | ORAL | 0 refills | Status: DC

## 2023-11-05 NOTE — Progress Notes (Signed)
 WEIGHT SUMMARY AND BIOMETRICS  Vitals Temp: 98.3 F (36.8 C) BP: 133/77 Pulse Rate: 67 SpO2: 98 %   Anthropometric Measurements Height: 5\' 8"  (1.727 m) Weight: (!) 341 lb (154.7 kg) BMI (Calculated): 51.86 Weight at Last Visit: 340lb Weight Lost Since Last Visit: 0 Weight Gained Since Last Visit: 1lb Starting Weight: 366lb Total Weight Loss (lbs): 25 lb (11.3 kg) Peak Weight: 366lb   Body Composition  Body Fat %: 53 % Fat Mass (lbs): 181.2 lbs Muscle Mass (lbs): 152.6 lbs Total Body Water (lbs): 118 lbs Visceral Fat Rating : 19   Other Clinical Data Fasting: no Labs: no Today's Visit #: 18 Starting Date: 10/03/22    Chief Complaint:   OBESITY Tabitha Golden is here to discuss her progress with her obesity treatment plan.  She is on the the Category 4 Plan and states she is following her eating plan approximately 90 % of the time.  She states she is exercising: NEAT Activities   Interim History:  She and her husband have been working on home renovation, recently placed their home on the market- RECEIVED AN OFFER TODAY! They placed an offer on a new home last week. Anticipated move date August 2025  She is married and lives with her husband and two children Son age 74 Daughter age 64  Of note- 2019 Tubal Ligation  She never started Lomaira and Topiramate provided at her last OV on 09/10/2023 She is concerned that Lomir could worsen pre-existing HTN  Subjective:   1. Essential hypertension BP slightly above goal at OV She is currently on  losartan -hydrochlorothiazide (HYZAAR) 100-25 MG tablet   2. Vitamin D  deficiency  Latest Reference Range & Units 10/03/22 09:57 08/15/23 10:28  Vitamin D , 25-Hydroxy 30.0 - 100.0 ng/mL 19.0 (L) 41.5   She is on weekly Ergocalciferol - denies N/V/Muscle Weakness  3. Polyphagia 09/10/2023 [facility] OV Notes:  Polyphagia     Medication options were discussed extensively with patient today.  Given availability, cost,  insurance coverage and other medical comorbidities the decision was reached to start medications Lomaira and Topiramate.  These medications will be used as a substitute for brand name Qsymia in doses that are synanomous.  Patient understands this is an off label usage.  We discussed the titration schedule with the goal of 5% weight loss at 3 months at a treatment dose.  The first two weeks will be a starting dose of 25mg  of Topiramate and 4mg  of Lomaira.  After two weeks the patient will increase to 50mg  of Topiramate and 8mg  of Lomaira and will stay on this dose until the next appointment.  Controlled substance contract was discussed and signed today.        She NEVER started make shift Qsymia over concerns for SE and that the "medication might not work". She was previoulsy on Metformin 500mg  therapy, she believes max dose was 1500mg /day She used Metformin > 20 years ago and believed that she tolerated it well  She denies family hx of MENS 2 or MTC She denies personal hx of pancreatitis She has had a tubal ligation 2019 Discussed risks/benefits of Zepbound Rec GIP/GLP-1 therapy due to her: Prediabetes Insulin  Resistance HTN Obesity  Assessment/Plan:   1. Essential hypertension Limit Na+ intake Remain active daily  2. Vitamin D  deficiency (Primary) Refill Vitamin D , Ergocalciferol , (DRISDOL ) 1.25 MG (50000 UNIT) CAPS capsule Take 1 capsule (50,000 Units total) by mouth every 7 (seven) days. Dispense: 4 capsule, Refills: 0 ordered   3. Polyphagia  Remain off Phentermine and Topiramate  Start  metFORMIN (GLUCOPHAGE-XR) 500 MG 24 hr tablet Take 1 tablet (500 mg total) by mouth daily with breakfast. Dispense: 30 tablet, Refills: 0 ordered   Start tirzepatide (ZEPBOUND) 2.5 MG/0.5ML Pen Inject 2.5 mg into the skin once a week. Dispense: 3 mL, Refills: 0 ordered   4. BMI 50.0-59.9, adult (HCC), CURRENT BMI 51.86 Start tirzepatide (ZEPBOUND) 2.5 MG/0.5ML Pen Inject 2.5 mg into the skin  once a week. Dispense: 3 mL, Refills: 0 ordered   Adithi is currently in the action stage of change. As such, her goal is to continue with weight loss efforts. She has agreed to the Category 4 Plan.   Exercise goals: All adults should avoid inactivity. Some physical activity is better than none, and adults who participate in any amount of physical activity gain some health benefits. Adults should also include muscle-strengthening activities that involve all major muscle groups on 2 or more days a week.  Behavioral modification strategies: increasing lean protein intake, decreasing simple carbohydrates, increasing vegetables, increasing water intake, no skipping meals, meal planning and cooking strategies, keeping healthy foods in the home, ways to avoid boredom eating, and planning for success.  Yovanna has agreed to follow-up with our clinic in 4 weeks. She was informed of the importance of frequent follow-up visits to maximize her success with intensive lifestyle modifications for her multiple health conditions.   Check Fasting Labs Summer 2025  Objective:   Blood pressure 133/77, pulse 67, temperature 98.3 F (36.8 C), height 5\' 8"  (1.727 m), weight (!) 341 lb (154.7 kg), last menstrual period 10/01/2023, SpO2 98%. Body mass index is 51.85 kg/m.  General: Cooperative, alert, well developed, in no acute distress. HEENT: Conjunctivae and lids unremarkable. Cardiovascular: Regular rhythm.  Lungs: Normal work of breathing. Neurologic: No focal deficits.   Lab Results  Component Value Date   CREATININE 0.75 08/15/2023   BUN 13 08/15/2023   NA 141 08/15/2023   K 3.6 08/15/2023   CL 104 08/15/2023   CO2 23 08/15/2023   Lab Results  Component Value Date   ALT 18 08/15/2023   AST 17 08/15/2023   ALKPHOS 96 08/15/2023   BILITOT 0.4 08/15/2023   Lab Results  Component Value Date   HGBA1C 5.8 (H) 08/15/2023   HGBA1C 6.2 (H) 10/03/2022   Lab Results  Component Value Date   INSULIN   29.2 (H) 08/15/2023   INSULIN  68.5 (H) 10/03/2022   Lab Results  Component Value Date   TSH 2.920 10/03/2022   Lab Results  Component Value Date   CHOL 159 08/15/2023   HDL 36 (L) 08/15/2023   LDLCALC 98 08/15/2023   TRIG 138 08/15/2023   Lab Results  Component Value Date   VD25OH 41.5 08/15/2023   VD25OH 19.0 (L) 10/03/2022   Lab Results  Component Value Date   WBC 9.9 08/15/2023   HGB 12.8 08/15/2023   HCT 39.6 08/15/2023   MCV 82 08/15/2023   PLT 325 08/15/2023   Lab Results  Component Value Date   IRON 114 08/15/2023   TIBC 316 08/15/2023   FERRITIN 36 08/15/2023   Attestation Statements:   Reviewed by clinician on day of visit: allergies, medications, problem list, medical history, surgical history, family history, social history, and previous encounter notes.  I have reviewed the above documentation for accuracy and completeness, and I agree with the above. -  Caeson Filippi d. Kenly Xiao, NP-C

## 2023-11-06 ENCOUNTER — Telehealth (INDEPENDENT_AMBULATORY_CARE_PROVIDER_SITE_OTHER): Payer: Self-pay

## 2023-11-06 NOTE — Telephone Encounter (Signed)
 The PA for the Zepbond has been approved. You can now fill your prescription, approved covered according to your plan. Approval date is : 11/06/23-07/08/2024 Patient been updated thru Mychart.

## 2023-11-06 NOTE — Telephone Encounter (Signed)
 PA for Zepbound 2.5 has been submitted, awaiting PA questions.

## 2023-11-06 NOTE — Telephone Encounter (Signed)
 PA questions for Zepbound 2.5  have been answered and all documentation has been included. Waiting on a determination.

## 2023-11-30 ENCOUNTER — Other Ambulatory Visit (INDEPENDENT_AMBULATORY_CARE_PROVIDER_SITE_OTHER): Payer: Self-pay | Admitting: Adult Health

## 2023-12-02 ENCOUNTER — Telehealth (INDEPENDENT_AMBULATORY_CARE_PROVIDER_SITE_OTHER): Admitting: Psychology

## 2023-12-02 DIAGNOSIS — F419 Anxiety disorder, unspecified: Secondary | ICD-10-CM

## 2023-12-02 DIAGNOSIS — F5089 Other specified eating disorder: Secondary | ICD-10-CM | POA: Diagnosis not present

## 2023-12-02 NOTE — Progress Notes (Signed)
  Office: [phone]  /  Fax: [phone]    Date: December 02, 2023  Appointment Start Time: 9:32am Duration: 32 minutes Provider: Catherene Close, Psy.D. Type of Session: Individual Therapy  Location of Patient: Home (private location) Location of Provider: Provider's Home (private office) Type of Contact: Telepsychological Visit via MyChart Video Visit  Session Content: Tabitha Golden is a 38 y.o. female presenting for a follow-up appointment to address the previously established treatment goal of increasing coping skills.Today's appointment was a telepsychological visit. Tabitha Golden provided verbal consent for today's telepsychological appointment and she is aware she is responsible for securing confidentiality on her end of the session. Prior to proceeding with today's appointment, Tabitha Golden's physical location at the time of this appointment was obtained as well a phone number she could be reached at in the event of technical difficulties. Tabitha Golden and this provider participated in today's telepsychological service.   This provider conducted a brief check-in. Tabitha Golden shared she will be closing on her old and new house this Friday, noting It has been a complete whirlwind. Associated thoughts and feelings processed. Regarding eating habits, Tabitha Golden discussed sticking to the plan. Reviewed story stuck on repeat from the last appointment. Discussed myths regarding self-compassion and how it relates to eating habits and weight loss. Overall, Tabitha Golden was receptive to today's appointment as evidenced by openness to sharing, responsiveness to feedback, and willingness to continue engaging in learned skills.  Mental Status Examination:  Appearance: neat Behavior: appropriate to circumstances Mood: neutral Affect: mood congruent Speech: WNL Eye Contact: appropriate Psychomotor Activity: WNL Gait: unable to assess Thought Process: linear, logical, and goal directed and no evidence or endorsement of suicidal, homicidal,  and self-harm ideation, plan and intent  Thought Content/Perception: no hallucinations, delusions, bizarre thinking or behavior endorsed or observed Orientation: AAOx4 Memory/Concentration: intact Insight: good Judgment: good   Interventions:  Conducted a brief chart review Provided empathic reflections and validation Reviewed content from the previous session Provided positive reinforcement Employed supportive psychotherapy interventions to facilitate reduced distress and to improve coping skills with identified stressors Psychoeducation provided regarding self-compassion  DSM-5 Diagnosis(es): F50.89 Other Specified Feeding or Eating Disorder, Emotional Eating Behaviors and F41.9 Unspecified Anxiety Disorder  Treatment Goal & Progress: During the initial appointment with this provider, the following treatment goal was established: increase coping skills. [name] has demonstrated progress in her goal as evidenced by increased awareness of hunger patterns, increased awareness of triggers for emotional eating behaviors, and reduction in emotional eating behaviors. [name] also continues to demonstrate willingness to engage in learned skill(s).   Plan: Due to the upcoming move and a vacation, the next appointment is scheduled for 12/30/2023 at 10am, which will be via MyChart Video Visit. The next session will focus on working towards the established treatment goal.   Catherene Close, PsyD

## 2023-12-17 ENCOUNTER — Ambulatory Visit (INDEPENDENT_AMBULATORY_CARE_PROVIDER_SITE_OTHER): Admitting: Family Medicine

## 2023-12-24 ENCOUNTER — Ambulatory Visit (INDEPENDENT_AMBULATORY_CARE_PROVIDER_SITE_OTHER): Admitting: Adult Health

## 2023-12-30 ENCOUNTER — Telehealth (INDEPENDENT_AMBULATORY_CARE_PROVIDER_SITE_OTHER): Admitting: Psychology

## 2023-12-30 DIAGNOSIS — F419 Anxiety disorder, unspecified: Secondary | ICD-10-CM

## 2023-12-30 DIAGNOSIS — F5089 Other specified eating disorder: Secondary | ICD-10-CM

## 2023-12-30 NOTE — Progress Notes (Signed)
  Office: [phone]  /  Fax: [phone]    Date: December 30, 2023  Appointment Start Time: 10:02am Duration: 23 minutes Provider: Wyatt Fire, Psy.D. Type of Session: Individual Therapy  Location of Patient: Home (private location) Location of Provider: Provider's Home (private office) Type of Contact: Telepsychological Visit via MyChart Video Visit  Session Content: Tabitha Golden is a 38 y.o. female presenting for a follow-up appointment to address the previously established treatment goal of increasing coping skills. Today's appointment was a telepsychological visit. Tabitha Golden provided verbal consent for today's telepsychological appointment and she is aware she is responsible for securing confidentiality on her end of the session. Prior to proceeding with today's appointment, Tabitha Golden's physical location at the time of this appointment was obtained as well a phone number she could be reached at in the event of technical difficulties. Tabitha Golden and this provider participated in today's telepsychological service.   This provider conducted a brief check-in. Tabitha Golden stated she moved into her new home, noting We've settled in pretty well. Despite still unpacking, she indicated she is staying on top of the grocery list and making choices congruent to her goals. She also indicated she is not engaging in emotional eating behaviors and described an increased ability to cope. Further discussed self-compassion and Tabitha Golden provided examples and discussed how these examples differ from the story her mind used to tell her about weight loss/eating habits. Furthermore, termination planning was discussed. Tabitha Golden was receptive to a follow-up appointment in 3-4 weeks and an additional follow-up/termination appointment in 4-5 weeks after that. Overall, Tabitha Golden was receptive to today's appointment as evidenced by openness to sharing, responsiveness to feedback, and willingness to continue engaging in learned skills.  Mental Status  Examination:  Appearance: neat Behavior: appropriate to circumstances Mood: neutral Affect: mood congruent Speech: WNL Eye Contact: appropriate Psychomotor Activity: WNL Gait: unable to assess Thought Process: linear, logical, and goal directed and no evidence or endorsement of suicidal, homicidal, and self-harm ideation, plan and intent  Thought Content/Perception: no hallucinations, delusions, bizarre thinking or behavior endorsed or observed Orientation: AAOx4 Memory/Concentration: intact Insight: good Judgment: good  Interventions:  Conducted a brief chart review Provided empathic reflections and validation Reviewed content from the previous session Provided positive reinforcement Employed supportive psychotherapy interventions to facilitate reduced distress and to improve coping skills with identified stressors Discussed termination planning  DSM-5 Diagnosis(es): F50.89 Other Specified Feeding or Eating Disorder, Emotional Eating Behaviors and F41.9 Unspecified Anxiety Disorder  Treatment Goal & Progress:  During the initial appointment with this provider, the following treatment goal was established: increase coping skills. Tabitha Golden has demonstrated progress in her goal as evidenced by increased awareness of hunger patterns, increased awareness of triggers for emotional eating behaviors, and reduction in emotional eating behaviors. Tabitha Golden also continues to demonstrate willingness to engage in learned skill(s).   Plan: The next appointment is scheduled for 01/27/2024 at 10am, which will be via MyChart Video Visit. The next session will focus on working towards the established treatment goal.   Wyatt Fire, PsyD

## 2023-12-31 ENCOUNTER — Ambulatory Visit (INDEPENDENT_AMBULATORY_CARE_PROVIDER_SITE_OTHER): Admitting: Family Medicine

## 2024-01-02 ENCOUNTER — Ambulatory Visit (INDEPENDENT_AMBULATORY_CARE_PROVIDER_SITE_OTHER): Admitting: Adult Health

## 2024-01-02 ENCOUNTER — Encounter (INDEPENDENT_AMBULATORY_CARE_PROVIDER_SITE_OTHER): Payer: Self-pay | Admitting: Adult Health

## 2024-01-02 VITALS — BP 132/80 | HR 66 | Temp 98.0°F | Ht 68.0 in | Wt 340.0 lb

## 2024-01-02 DIAGNOSIS — R632 Polyphagia: Secondary | ICD-10-CM

## 2024-01-02 DIAGNOSIS — E559 Vitamin D deficiency, unspecified: Secondary | ICD-10-CM

## 2024-01-02 DIAGNOSIS — I10 Essential (primary) hypertension: Secondary | ICD-10-CM

## 2024-01-02 DIAGNOSIS — R7303 Prediabetes: Secondary | ICD-10-CM | POA: Diagnosis not present

## 2024-01-02 DIAGNOSIS — Z6841 Body Mass Index (BMI) 40.0 and over, adult: Secondary | ICD-10-CM

## 2024-01-02 DIAGNOSIS — E669 Obesity, unspecified: Secondary | ICD-10-CM

## 2024-01-02 MED ORDER — METFORMIN HCL ER 500 MG PO TB24: 500.0000 mg | ORAL_TABLET | Freq: Every day | ORAL | 0 refills | Status: DC

## 2024-01-02 MED ORDER — VITAMIN D (ERGOCALCIFEROL) 1.25 MG (50000 UNIT) PO CAPS: 50000.0000 [IU] | ORAL_CAPSULE | ORAL | 0 refills | Status: DC

## 2024-01-02 MED ORDER — LOSARTAN POTASSIUM-HCTZ 100-25 MG PO TABS: 1.0000 | ORAL_TABLET | Freq: Every day | ORAL | 0 refills | Status: DC

## 2024-01-02 MED ORDER — ZEPBOUND 5 MG/0.5ML ~~LOC~~ SOAJ: 5.0000 mg | SUBCUTANEOUS | 0 refills | Status: DC

## 2024-01-02 NOTE — Progress Notes (Signed)
 WEIGHT SUMMARY AND BIOMETRICS  Vitals Temp: 98 F (36.7 C) BP: 132/80 Pulse Rate: 66 SpO2: 100 %   Anthropometric Measurements Height: 5' 8 (1.727 m) Weight: (!) 340 lb (154.2 kg) BMI (Calculated): 51.71 Weight at Last Visit: 340 lb Weight Lost Since Last Visit: 1 lb Weight Gained Since Last Visit: 0 Starting Weight: 366 lb Total Weight Loss (lbs): 26 lb (11.8 kg) Peak Weight: 366 lb   Body Composition  Body Fat %: 52.5 % Fat Mass (lbs): 178.8 lbs Muscle Mass (lbs): 153.4 lbs Total Body Water (lbs): 114 lbs Visceral Fat Rating : 18   Other Clinical Data Fasting: yes Labs: no Today's Visit #: 19 Starting Date: 10/03/22    Chief Complaint:   OBESITY Tabitha Golden is here to discuss her progress with her obesity treatment plan.  She is on the the Category 4 Plan and states she is following her eating plan approximately 75 % of the time.  She states she is exercising Moving/Swimming all day/30 minutes 7/1-2 times per week.  Interim History:  Ms. Tabitha Golden and her family have fully moved into their new home. They have also successfully sold their previous home. The family has enjoyed swimming at neighborhood pool- multiple times per week.  She is thrilled to have lost weight during the move and a recent vacation.  Bioimpedance results: Muscle Mass: +0.8 lb Adipose Mass: -2.4 lbs  Of note- 2019 Tubal Ligation   Subjective:   1. Prediabetes Lab Results  Component Value Date   HGBA1C 5.8 (H) 08/15/2023   HGBA1C 6.2 (H) 10/03/2022    She never started Qysima and preferred to start incretin therapy. 11/05/2023 she was started on Metformin  500mg  daily and weekly Zepbound  2.5mg  She reports significant reduction on cravings and food noise. Denies mass in neck, dysphagia, dyspepsia, persistent hoarseness, abdominal pain, or N/V/C   2. Polyphagia She never started Qysima and preferred to start incretin therapy. 11/05/2023 she was started on Metformin  500mg  daily  and weekly Zepbound  2.5mg  She reports significant reduction on cravings and food noise. Denies mass in neck, dysphagia, dyspepsia, persistent hoarseness, abdominal pain, or N/V/C   3. Essential hypertension BP slightly elevated at OV She denies CP with exertion  4. Vitamin D  deficiency  Latest Reference Range & Units 08/15/23 10:28  Vitamin D , 25-Hydroxy 30.0 - 100.0 ng/mL 41.5   She is on weekly Ergocalciferol - denies N/V/Muscle Weakness  Assessment/Plan:   1. Prediabetes (Primary) Refill metFORMIN  (GLUCOPHAGE -XR) 500 MG 24 hr tablet Take 1 tablet (500 mg total) by mouth daily with breakfast. Dispense: 30 tablet, Refills: 0 ordered   2. Polyphagia Continue healthy eating and regular exercise  3. Essential hypertension Refill - losartan -hydrochlorothiazide (HYZAAR) 100-25 MG tablet; Take 1 tablet by mouth daily.  Dispense: 90 tablet; Refill: 0 Limit Na+ intake  4. Vitamin D  deficiency Refill - Vitamin D , Ergocalciferol , (DRISDOL ) 1.25 MG (50000 UNIT) CAPS capsule; Take 1 capsule (50,000 Units total) by mouth every 7 (seven) days.  Dispense: 4 capsule; Refill: 0  5. BMI 50.0-59.9, adult (HCC), CURRENT BMI 51.8 Refill and INCREASE  tirzepatide  (ZEPBOUND ) 5 MG/0.5ML Pen Inject 5 mg into the skin once a week. Dispense: 2 mL, Refills: 0 ordered   [name] is currently in the action stage of change. As such, her goal is to continue with weight loss efforts. She has agreed to the Category 4 Plan.   Exercise goals: All adults should avoid inactivity. Some physical activity is better than none, and adults who participate in  any amount of physical activity gain some health benefits. Adults should also include muscle-strengthening activities that involve all major muscle groups on 2 or more days a week.  Behavioral modification strategies: increasing lean protein intake, decreasing simple carbohydrates, increasing vegetables, increasing water intake, decreasing eating out, no skipping  meals, meal planning and cooking strategies, keeping healthy foods in the home, and planning for success.  Viola has agreed to follow-up with our clinic in 4 weeks. She was informed of the importance of frequent follow-up visits to maximize her success with intensive lifestyle modifications for her multiple health conditions.   Check Fasting Labs at next OV  Objective:   Blood pressure 132/80, pulse 66, temperature 98 F (36.7 C), height 5' 8 (1.727 m), weight (!) 340 lb (154.2 kg), SpO2 100%. Body mass index is 51.7 kg/m.  General: Cooperative, alert, well developed, in no acute distress. HEENT: Conjunctivae and lids unremarkable. Cardiovascular: Regular rhythm.  Lungs: Normal work of breathing. Neurologic: No focal deficits.   Lab Results  Component Value Date   CREATININE 0.75 08/15/2023   BUN 13 08/15/2023   NA 141 08/15/2023   K 3.6 08/15/2023   CL 104 08/15/2023   CO2 23 08/15/2023   Lab Results  Component Value Date   ALT 18 08/15/2023   AST 17 08/15/2023   ALKPHOS 96 08/15/2023   BILITOT 0.4 08/15/2023   Lab Results  Component Value Date   HGBA1C 5.8 (H) 08/15/2023   HGBA1C 6.2 (H) 10/03/2022   Lab Results  Component Value Date   INSULIN  29.2 (H) 08/15/2023   INSULIN  68.5 (H) 10/03/2022   Lab Results  Component Value Date   TSH 2.920 10/03/2022   Lab Results  Component Value Date   CHOL 159 08/15/2023   HDL 36 (L) 08/15/2023   LDLCALC 98 08/15/2023   TRIG 138 08/15/2023   Lab Results  Component Value Date   VD25OH 41.5 08/15/2023   VD25OH 19.0 (L) 10/03/2022   Lab Results  Component Value Date   WBC 9.9 08/15/2023   HGB 12.8 08/15/2023   HCT 39.6 08/15/2023   MCV 82 08/15/2023   PLT 325 08/15/2023   Lab Results  Component Value Date   IRON 114 08/15/2023   TIBC 316 08/15/2023   FERRITIN 36 08/15/2023   Attestation Statements:   Reviewed by clinician on day of visit: allergies, medications, problem list, medical history, surgical  history, family history, social history, and previous encounter notes.  I have reviewed the above documentation for accuracy and completeness, and I agree with the above. -  Frans Valente d. Shakita Keir, NP-C

## 2024-01-24 ENCOUNTER — Telehealth: Payer: Self-pay | Admitting: Family Medicine

## 2024-01-24 NOTE — Telephone Encounter (Signed)
 2nd attempt to reach out to the pt to schedule a TOC appointment.

## 2024-01-27 ENCOUNTER — Telehealth (INDEPENDENT_AMBULATORY_CARE_PROVIDER_SITE_OTHER): Admitting: Psychology

## 2024-01-27 ENCOUNTER — Other Ambulatory Visit (INDEPENDENT_AMBULATORY_CARE_PROVIDER_SITE_OTHER): Payer: Self-pay | Admitting: Adult Health

## 2024-01-27 DIAGNOSIS — F419 Anxiety disorder, unspecified: Secondary | ICD-10-CM

## 2024-01-27 DIAGNOSIS — F5089 Other specified eating disorder: Secondary | ICD-10-CM

## 2024-01-27 DIAGNOSIS — E559 Vitamin D deficiency, unspecified: Secondary | ICD-10-CM

## 2024-01-27 NOTE — Progress Notes (Signed)
  Office: [phone]  /  Fax: [phone]    Date: January 27, 2024  Appointment Start Time: 10:07am Duration: 26 minutes Provider: Wyatt Fire, Psy.D. Type of Session: Individual Therapy  Location of Patient: Home (private location) Location of Provider: Provider's Home (private office) Type of Contact: Telepsychological Visit via MyChart Video Visit  Session Content: Tabitha Golden is a 38 y.o. female presenting for a follow-up appointment to address the previously established treatment goal of increasing coping skills. Today's appointment was a telepsychological visit. Tabitha Golden provided verbal consent for today's telepsychological appointment and she is aware she is responsible for securing confidentiality on her end of the session. Prior to proceeding with today's appointment, Tabitha Golden's physical location at the time of this appointment was obtained as well a phone number she could be reached at in the event of technical difficulties. Tabitha Golden and this provider participated in today's telepsychological service.   This provider conducted a brief check-in. Tabitha Golden shared being busy with events, celebrations, and prep for the upcoming school year, including finding a school that is a good fit for her son given recent circumstances/stressors. Associated thoughts and feelings were explored and processed. As it relates to her eating habits, she continues to focus on making better choices and engaging in portion control. Remainder of today's appointment focus further on self-compassion, including engaging Tabitha Golden in [employer]. Her experience was processed after. Overall, Tabitha Golden was receptive to today's appointment as evidenced by openness to sharing, responsiveness to feedback, and willingness to continue engaging in learned skills.  Mental Status Examination:  Appearance: neat Behavior: appropriate to circumstances Mood: sad Affect: mood congruent; tearful when engaging in exercise Speech: WNL Eye  Contact: appropriate Psychomotor Activity: WNL Gait: unable to assess Thought Process: linear, logical, and goal directed and no evidence or endorsement of suicidal, homicidal, and self-harm ideation, plan and intent  Thought Content/Perception: no hallucinations, delusions, bizarre thinking or behavior endorsed or observed Orientation: AAOx4 Memory/Concentration: intact Insight: good Judgment: good  Interventions:  Conducted a brief chart review Provided empathic reflections and validation Reviewed content from the previous session Provided positive reinforcement Employed supportive psychotherapy interventions to facilitate reduced distress and to improve coping skills with identified stressors Engaged pt in a self-compassion exercise  DSM-5 Diagnosis(es): F50.89 Other Specified Feeding or Eating Disorder, Emotional Eating Behaviors and F41.9 Unspecified Anxiety Disorder  Treatment Goal & Progress: During the initial appointment with this provider, the following treatment goal was established: increase coping skills. Tabitha Golden has demonstrated progress in her goal as evidenced by increased awareness of hunger patterns, increased awareness of triggers for emotional eating behaviors, and reduction in emotional eating behaviors. Tabitha Golden also continues to demonstrate willingness to engage in learned skill(s).   Plan: Due to recent stressors, the next appointment is scheduled for 02/10/2024 at 2pm, which will be via MyChart Video Visit. The next session will focus on working towards the established treatment goal and possible termination.  Wyatt Fire, PsyD

## 2024-02-04 ENCOUNTER — Other Ambulatory Visit (INDEPENDENT_AMBULATORY_CARE_PROVIDER_SITE_OTHER): Payer: Self-pay | Admitting: Adult Health

## 2024-02-10 ENCOUNTER — Encounter (INDEPENDENT_AMBULATORY_CARE_PROVIDER_SITE_OTHER): Payer: Self-pay | Admitting: Adult Health

## 2024-02-10 ENCOUNTER — Telehealth (INDEPENDENT_AMBULATORY_CARE_PROVIDER_SITE_OTHER): Admitting: Psychology

## 2024-02-10 DIAGNOSIS — F419 Anxiety disorder, unspecified: Secondary | ICD-10-CM | POA: Diagnosis not present

## 2024-02-10 DIAGNOSIS — F5089 Other specified eating disorder: Secondary | ICD-10-CM

## 2024-02-10 NOTE — Progress Notes (Signed)
  Office: [phone]  /  Fax: [phone]    Date: February 10, 2024  Appointment Start Time: 2:03pm Duration: 29 minutes Provider: Wyatt Fire, Psy.D. Type of Session: Individual Therapy  Location of Patient: Home (private location) Location of Provider: Provider's Home (private office) Type of Contact: Telepsychological Visit via MyChart Video Visit  Session Content: Tabitha Golden is a 38 y.o. female presenting for a follow-up appointment to address the previously established treatment goal of increasing coping skills.Today's appointment was a telepsychological visit. Burnard provided verbal consent for today's telepsychological appointment and she is aware she is responsible for securing confidentiality on her end of the session. Prior to proceeding with today's appointment, [name]'s physical location at the time of this appointment was obtained as well a phone number she could be reached at in the event of technical difficulties. Burnard and this provider participated in today's telepsychological service.   This provider conducted a brief check-in. [name] reported her son was admitted to an online school, which significantly reduced her stress. She also shared about how she and her family are working to find their new normal. Despite recent stressors and adjustments, [name] continues to report a reduction in emotional eating behaviors. As she continues to adjust to taking Zepbound , she acknowledged challenges with eating regularly due to lack of hunger cues/ food noise. Further explored. She agreed to set reminders on her phone based on her previous eating schedule, while also being mindful regarding snacking that occurred prior to starting Zepbound  and also taking into account smaller/frequent meals may be more helpful due to the decrease in food noise and appetite. Overall, [name] was receptive to today's appointment as evidenced by openness to sharing, responsiveness to feedback, and willingness to  implement discussed strategies .  Mental Status Examination:  Appearance: neat Behavior: appropriate to circumstances Mood: neutral Affect: mood congruent Speech: WNL Eye Contact: appropriate Psychomotor Activity: WNL Gait: unable to assess Thought Process: linear, logical, and goal directed and no evidence or endorsement of suicidal, homicidal, and self-harm ideation, plan and intent  Thought Content/Perception: no hallucinations, delusions, bizarre thinking or behavior endorsed or observed Orientation: AAOx4 Memory/Concentration: intact Insight: good Judgment: good  Interventions:  Conducted a brief chart review Provided empathic reflections and validation Reviewed content from the previous session Provided positive reinforcement Employed supportive psychotherapy interventions to facilitate reduced distress and to improve coping skills with identified stressors Engaged patient in problem solving  DSM-5 Diagnosis(es): F50.89 Other Specified Feeding or Eating Disorder, Emotional Eating Behaviors and F41.9 Unspecified Anxiety Disorder  Treatment Goal & Progress: During the initial appointment with this provider, the following treatment goal was established: increase coping skills. [name] has demonstrated progress in her goal as evidenced by increased awareness of hunger patterns, increased awareness of triggers for emotional eating behaviors, and reduction in emotional eating behaviors. [name] also continues to demonstrate willingness to engage in learned skill(s).   Plan: The next appointment is scheduled for 03/09/2024 at 2pm, which will be via MyChart Video Visit. The next session will focus on working towards the established treatment goal and termination.    Wyatt Fire, PsyD

## 2024-02-11 ENCOUNTER — Encounter (INDEPENDENT_AMBULATORY_CARE_PROVIDER_SITE_OTHER): Payer: Self-pay | Admitting: Adult Health

## 2024-02-11 ENCOUNTER — Other Ambulatory Visit (INDEPENDENT_AMBULATORY_CARE_PROVIDER_SITE_OTHER): Payer: Self-pay | Admitting: Adult Health

## 2024-02-11 DIAGNOSIS — I10 Essential (primary) hypertension: Secondary | ICD-10-CM

## 2024-02-11 MED ORDER — LOSARTAN POTASSIUM-HCTZ 100-25 MG PO TABS: 1.0000 | ORAL_TABLET | Freq: Every day | ORAL | 0 refills | Status: DC

## 2024-02-12 ENCOUNTER — Ambulatory Visit (INDEPENDENT_AMBULATORY_CARE_PROVIDER_SITE_OTHER): Admitting: Adult Health

## 2024-02-13 ENCOUNTER — Ambulatory Visit (INDEPENDENT_AMBULATORY_CARE_PROVIDER_SITE_OTHER): Admitting: Family Medicine

## 2024-02-13 ENCOUNTER — Encounter (INDEPENDENT_AMBULATORY_CARE_PROVIDER_SITE_OTHER): Payer: Self-pay | Admitting: Family Medicine

## 2024-02-13 VITALS — BP 138/92 | HR 79 | Temp 98.1°F | Ht 68.0 in | Wt 336.0 lb

## 2024-02-13 DIAGNOSIS — E785 Hyperlipidemia, unspecified: Secondary | ICD-10-CM

## 2024-02-13 DIAGNOSIS — R7303 Prediabetes: Secondary | ICD-10-CM

## 2024-02-13 DIAGNOSIS — E559 Vitamin D deficiency, unspecified: Secondary | ICD-10-CM | POA: Diagnosis not present

## 2024-02-13 DIAGNOSIS — F339 Major depressive disorder, recurrent, unspecified: Secondary | ICD-10-CM | POA: Diagnosis not present

## 2024-02-13 DIAGNOSIS — Z6841 Body Mass Index (BMI) 40.0 and over, adult: Secondary | ICD-10-CM

## 2024-02-13 DIAGNOSIS — E669 Obesity, unspecified: Secondary | ICD-10-CM

## 2024-02-13 MED ORDER — SERTRALINE HCL 50 MG PO TABS: 75.0000 mg | ORAL_TABLET | Freq: Every day | ORAL | 0 refills | Status: AC

## 2024-02-13 MED ORDER — VITAMIN D (ERGOCALCIFEROL) 1.25 MG (50000 UNIT) PO CAPS: 50000.0000 [IU] | ORAL_CAPSULE | ORAL | 0 refills | Status: DC

## 2024-02-13 MED ORDER — ZEPBOUND 5 MG/0.5ML ~~LOC~~ SOAJ: 5.0000 mg | SUBCUTANEOUS | 0 refills | Status: DC

## 2024-02-13 NOTE — Progress Notes (Signed)
 SUBJECTIVE:  Chief Complaint: Obesity  Interim History: Patient first appointment back with me since March- she is moved and her littlest started kindergarten today.  Her house was sold, her new house needs nothing. Since March she has been sticking with the foods she knows.  Eating most of the same foods.  She started Zepbound  and it has worked very well. Hunger is controlled as are cravings.  Upcoming few weeks she will be settling into her new house and staying home as the summer ends.  Trying to get into a better habit of food prep and planning. She is going to the beach for [place] day weekend.  Quaniyah is here to discuss her progress with her obesity treatment plan. She is on the Category 4 Plan and states she is following her eating plan approximately 75 % of the time. She states she is exercising   OBJECTIVE: Visit Diagnoses: Problem List Items Addressed This Visit       Other   Vitamin D  deficiency   Relevant Medications   Vitamin D , Ergocalciferol , (DRISDOL ) 1.25 MG (50000 UNIT) CAPS capsule   Other Relevant Orders   VITAMIN D  25 Hydroxy (Vit-D Deficiency, Fractures)   Obesity with starting BMI of 55.8   Relevant Medications   tirzepatide  (ZEPBOUND ) 5 MG/0.5ML Pen   BMI 50.0-59.9, adult (HCC) Current BMI 52.6 - Primary   Relevant Medications   tirzepatide  (ZEPBOUND ) 5 MG/0.5ML Pen   Depression, recurrent (HCC)   Relevant Medications   sertraline  (ZOLOFT ) 50 MG tablet   Prediabetes   Relevant Orders   Comprehensive metabolic panel with GFR   Hemoglobin A1c   Insulin , random   Vitamin B12   Dyslipidemia   Relevant Orders   Lipid Panel With LDL/HDL Ratio    Vitals Temp: 01.8 F (36.7 C) BP: (!) 138/92 Pulse Rate: 79 SpO2: 99 %   Anthropometric Measurements Height: 5' 8 (1.727 m) Weight: (!) 336 lb (152.4 kg) BMI (Calculated): 51.1 Weight at Last Visit: 340 lb Weight Lost Since Last Visit: 4 Weight Gained Since Last Visit: 0 Starting Weight: 366 lb Total  Weight Loss (lbs): 30 lb (13.6 kg)   Body Composition  Body Fat %: 52.9 % Fat Mass (lbs): 150.4 lbs Muscle Mass (lbs): 150.4 lbs Total Body Water (lbs): 13.8 lbs Visceral Fat Rating : 18   Other Clinical Data Fasting: yes Labs: yes Today's Visit #: 20 Starting Date: 10/03/22 Comments: Cat 4     ASSESSMENT AND PLAN: Assessment & Plan Vitamin D  deficiency On prescription strength Vitamin D  with no side effects of nausea, vomiting or muscle weakness.  Needs a refill as well as repeat lab today to assess level. Depression, recurrent (HCC) Patient mood is improved on sertraline  75mg  daily.  No suicidal or homicidal ideation.  Needs a refill of medication today.  Dyslipidemia Patient has been working on lifestyle changes which includes dietary modifications.  Repeat fasting lipid panel today to assess the response of these changes on patients cholesterol. Prediabetes Patient is open to pharmacologic intervention for obesity that would benefit her blood sugars as well.  Will repeat CMP, A1c and Insulin  level today.  Patient to start GLP1 therapy.  Patient has been on metformin  so will draw B12 level today. BMI 50.0-59.9, adult (HCC)  Obesity, Beginning BMI of 55.8    Diet: Francis is currently in the action stage of change. As such, her goal is to continue with weight loss efforts and has agreed to the Category 4 Plan.  Exercise:  For substantial health benefits, adults should do at least 150 minutes (2 hours and 30 minutes) a week of moderate-intensity, or 75 minutes (1 hour and 15 minutes) a week of vigorous-intensity aerobic physical activity, or an equivalent combination of moderate- and vigorous-intensity aerobic activity. Aerobic activity should be performed in episodes of at least 10 minutes, and preferably, it should be spread throughout the week.  Behavior Modification:  We discussed the following Behavioral Modification Strategies today: increasing lean protein intake,  decreasing simple carbohydrates, increasing vegetables, meal planning and cooking strategies, and planning for success. We discussed various medication options to help [facility] with her weight loss efforts and we both agreed to continue zepbound  at current dose if insurance will cover.  If they will not cover medication will try wegovy  equivalent dose to see if insurance will cover that.  No follow-ups on file.   She was informed of the importance of frequent follow up visits to maximize her success with intensive lifestyle modifications for her multiple health conditions.  Attestation Statements:   Reviewed by clinician on day of visit: allergies, medications, problem list, medical history, surgical history, family history, social history, and previous encounter notes.     Adelita Cho, MD

## 2024-02-14 ENCOUNTER — Telehealth: Payer: Self-pay | Admitting: *Deleted

## 2024-02-14 LAB — COMPREHENSIVE METABOLIC PANEL WITH GFR
ALT: 16 IU/L (ref 0–32)
AST: 19 IU/L (ref 0–40)
Albumin: 4.1 g/dL (ref 3.9–4.9)
Alkaline Phosphatase: 101 IU/L (ref 44–121)
BUN/Creatinine Ratio: 16 (ref 9–23)
BUN: 14 mg/dL (ref 6–20)
Bilirubin Total: 0.4 mg/dL (ref 0.0–1.2)
CO2: 22 mmol/L (ref 20–29)
Calcium: 9.2 mg/dL (ref 8.7–10.2)
Chloride: 102 mmol/L (ref 96–106)
Creatinine, Ser: 0.85 mg/dL (ref 0.57–1.00)
Globulin, Total: 2.8 g/dL (ref 1.5–4.5)
Glucose: 79 mg/dL (ref 70–99)
Potassium: 4.1 mmol/L (ref 3.5–5.2)
Sodium: 139 mmol/L (ref 134–144)
Total Protein: 6.9 g/dL (ref 6.0–8.5)
eGFR: 89 mL/min/1.73 (ref >59)

## 2024-02-14 LAB — VITAMIN B12: Vitamin B-12: 679 pg/mL (ref 232–1245)

## 2024-02-14 LAB — LIPID PANEL WITH LDL/HDL RATIO
Cholesterol, Total: 169 mg/dL (ref 100–199)
HDL: 35 mg/dL — ABNORMAL LOW (ref >39)
LDL Chol Calc (NIH): 108 mg/dL — ABNORMAL HIGH (ref 0–99)
LDL/HDL Ratio: 3.1 ratio (ref 0.0–3.2)
Triglycerides: 145 mg/dL (ref 0–149)
VLDL Cholesterol Cal: 26 mg/dL (ref 5–40)

## 2024-02-14 LAB — HEMOGLOBIN A1C
Est. average glucose Bld gHb Est-mCnc: 114 mg/dL
Hgb A1c MFr Bld: 5.6 % (ref 4.8–5.6)

## 2024-02-14 LAB — VITAMIN D 25 HYDROXY (VIT D DEFICIENCY, FRACTURES): Vit D, 25-Hydroxy: 34.8 ng/mL (ref 30.0–100.0)

## 2024-02-14 LAB — INSULIN, RANDOM: INSULIN: 29.6 u[IU]/mL — ABNORMAL HIGH (ref 2.6–24.9)

## 2024-02-14 NOTE — Telephone Encounter (Signed)
 Preferred drug of choice is Wegovy. Patient's insurance would like patient to try [facility] before they will cover Zepbound .

## 2024-02-18 MED ORDER — WEGOVY 0.5 MG/0.5ML ~~LOC~~ SOAJ: 0.5000 mg | SUBCUTANEOUS | 0 refills | Status: DC

## 2024-02-18 NOTE — Telephone Encounter (Signed)
[name] sent to the pharmacy.

## 2024-02-23 NOTE — Assessment & Plan Note (Addendum)
 Patient is open to pharmacologic intervention for obesity that would benefit her blood sugars as well.  Will repeat CMP, A1c and Insulin  level today.  Patient to start GLP1 therapy.  Patient has been on metformin  so will draw B12 level today.

## 2024-02-23 NOTE — Assessment & Plan Note (Signed)
 Patient mood is improved on sertraline  75mg  daily.  No suicidal or homicidal ideation.  Needs a refill of medication today.

## 2024-02-23 NOTE — Assessment & Plan Note (Signed)
 On prescription strength Vitamin D  with no side effects of nausea, vomiting or muscle weakness.  Needs a refill as well as repeat lab today to assess level.

## 2024-02-23 NOTE — Assessment & Plan Note (Signed)
 Patient has been working on lifestyle changes which includes dietary modifications.  Repeat fasting lipid panel today to assess the response of these changes on patients cholesterol.

## 2024-03-09 ENCOUNTER — Telehealth (INDEPENDENT_AMBULATORY_CARE_PROVIDER_SITE_OTHER): Admitting: Psychology

## 2024-03-09 DIAGNOSIS — F419 Anxiety disorder, unspecified: Secondary | ICD-10-CM

## 2024-03-09 DIAGNOSIS — F5089 Other specified eating disorder: Secondary | ICD-10-CM | POA: Diagnosis not present

## 2024-03-09 NOTE — Progress Notes (Signed)
  Office: [phone]  /  Fax: [phone]    Date: March 09, 2024  Appointment Start Time: 2:01pm Duration: 20 minutes Provider: Wyatt Fire, Psy.D. Type of Session: Individual Therapy  Location of Patient: Home (private location) Location of Provider: Provider's Home (private office) Type of Contact: Telepsychological Visit via MyChart Video Visit  Session Content: Tabitha Golden is a 38 y.o. female presenting for a follow-up appointment to address the previously established treatment goal of increasing coping skills. Today's appointment was a telepsychological visit. Tabitha Golden provided verbal consent for today's telepsychological appointment and she is aware she is responsible for securing confidentiality on her end of the session. Prior to proceeding with today's appointment, Tabitha Golden's physical location at the time of this appointment was obtained as well a phone number she could be reached at in the event of technical difficulties. Tabitha Golden and this provider participated in today's telepsychological service.   This provider conducted a brief check-in. Tabitha Golden reported, Things have been going well lately. Reflected on progress to date, including non-scale victories and learned skills. She shared various examples. Overall, Tabitha Golden was receptive to today's appointment as evidenced by openness to sharing, responsiveness to feedback, and willingness to continue engaging in learned skills.  Mental Status Examination:  Appearance: neat Behavior: appropriate to circumstances Mood: neutral Affect: mood congruent Speech: WNL Eye Contact: appropriate Psychomotor Activity: WNL Gait: unable to assess Thought Process: linear, logical, and goal directed and no evidence or endorsement of suicidal, homicidal, and self-harm ideation, plan and intent  Thought Content/Perception: no hallucinations, delusions, bizarre thinking or behavior endorsed or observed Orientation: AAOx4 Memory/Concentration:  intact Insight: good Judgment: good  Interventions:  Conducted a brief chart review Provided empathic reflections and validation Provided positive reinforcement Employed supportive psychotherapy interventions to facilitate reduced distress and to improve coping skills with identified stressors Reviewed learned skills  DSM-5 Diagnosis(es): F50.89 Other Specified Feeding or Eating Disorder, Emotional Eating Behaviors and F41.9 Unspecified Anxiety Disorder  Treatment Goal & Progress: During the initial appointment with this provider, the following treatment goal was established: increase coping skills. Tabitha Golden demonstrated progress in her goal as evidenced by increased awareness of hunger patterns, increased awareness of triggers for emotional eating behaviors, and reduction in emotional eating behaviors. Tabitha Golden also continues to demonstrate willingness to engage in learned skill(s).   Plan: As previously planned, today was [facility] last appointment with this provider. She acknowledged understanding that she may request a follow-up appointment with this provider in the future as long as she is still established with the clinic. No further follow-up planned by this provider.    Wyatt Fire, PsyD

## 2024-03-12 ENCOUNTER — Ambulatory Visit (INDEPENDENT_AMBULATORY_CARE_PROVIDER_SITE_OTHER): Admitting: Family Medicine

## 2024-03-12 ENCOUNTER — Encounter (INDEPENDENT_AMBULATORY_CARE_PROVIDER_SITE_OTHER): Payer: Self-pay | Admitting: Family Medicine

## 2024-03-12 DIAGNOSIS — E785 Hyperlipidemia, unspecified: Secondary | ICD-10-CM

## 2024-03-12 DIAGNOSIS — Z6841 Body Mass Index (BMI) 40.0 and over, adult: Secondary | ICD-10-CM | POA: Diagnosis not present

## 2024-03-12 DIAGNOSIS — E559 Vitamin D deficiency, unspecified: Secondary | ICD-10-CM | POA: Diagnosis not present

## 2024-03-12 MED ORDER — WEGOVY 1 MG/0.5ML ~~LOC~~ SOAJ: 1.0000 mg | SUBCUTANEOUS | 0 refills | Status: DC

## 2024-03-12 MED ORDER — VITAMIN D (ERGOCALCIFEROL) 1.25 MG (50000 UNIT) PO CAPS
50000.0000 [IU] | ORAL_CAPSULE | ORAL | 0 refills | Status: DC
Start: 2024-03-12 — End: 2024-05-12

## 2024-03-12 NOTE — Progress Notes (Signed)
 SUBJECTIVE:  Chief Complaint: Obesity  Interim History: Patient has still quite a bit going on in terms of extracurriculars and obligations.  She is getting into a more consistent routine and schedule.  Thursday and Friday get to be less hectic for her every week. She started Wegovy  and hasn't had any GI side effects.   She was sick the last week she was due for zepbound  and has only had 1 injection so far of Wegovy .  Her daughters birthday is coming up and they are having a birthday party and then patient also has a baby show this weekend.  Feels this will be the easiest to stay consistent with.  She wants to incorporate newer recipes.  Tabitha Golden is here to discuss her progress with her obesity treatment plan. She is on the Category 4 Plan and states she is following her eating plan approximately 80 % of the time. She states she is walking 20 minutes 2 times per week.   OBJECTIVE: Visit Diagnoses: Problem List Items Addressed This Visit       Other   Vitamin D  deficiency   Relevant Medications   Vitamin D , Ergocalciferol , (DRISDOL ) 1.25 MG (50000 UNIT) CAPS capsule   Obesity with starting BMI of 55.8 - Primary   Relevant Medications   semaglutide -weight management (WEGOVY ) 1 MG/0.5ML SOAJ SQ injection   BMI 50.0-59.9, adult (HCC) Current BMI 52.6   Relevant Medications   semaglutide -weight management (WEGOVY ) 1 MG/0.5ML SOAJ SQ injection   Dyslipidemia    Vitals Temp: 98.1 F (36.7 C) BP: (!) 152/95 Pulse Rate: 79 SpO2: 98 %   Anthropometric Measurements Height: 5' 8 (1.727 m) Weight: (!) 337 lb (152.9 kg) BMI (Calculated): 51.25 Weight at Last Visit: 336 lb Weight Lost Since Last Visit: 0 Weight Gained Since Last Visit: 1 Starting Weight: 366 lb Total Weight Loss (lbs): 29 lb (13.2 kg)   Body Composition  Body Fat %: 53.6 % Fat Mass (lbs): 180.8 lbs Muscle Mass (lbs): 148.6 lbs Total Body Water (lbs): 117.8 lbs Visceral Fat Rating : 19   Other Clinical  Data Today's Visit #: 21 Starting Date: 10/03/22 Comments: Cat 4     ASSESSMENT AND PLAN: Assessment & Plan Vitamin D  deficiency Patient's vitamin D  level is slightly decreased from previously.  She needs a refill of her prescription strength vitamin D  today.  64-month prescription sent in.  No nausea, vomiting, or muscle weakness reported but significant fatigue.  Repeat lab to assess supplementation in January Dyslipidemia Most recent fasting lipid panel showing an increase in LDL with a slight decrease in HDL qualifying her for dyslipidemia.  She is working on dietary and lifestyle changes to help decrease and control LDL.  Will continue these changes and will need follow-up labs in January 2026.  No medication is indicated at this time. Morbid obesity (HCC)  BMI 50.0-59.9, adult (HCC)    Diet: Muntaha is currently in the action stage of change. As such, her goal is to continue with weight loss efforts and has agreed to the Category 4 Plan.   Exercise:  For substantial health benefits, adults should do at least 150 minutes (2 hours and 30 minutes) a week of moderate-intensity, or 75 minutes (1 hour and 15 minutes) a week of vigorous-intensity aerobic physical activity, or an equivalent combination of moderate- and vigorous-intensity aerobic activity. Aerobic activity should be performed in episodes of at least 10 minutes, and preferably, it should be spread throughout the week.  Behavior Modification:  We discussed  the following Behavioral Modification Strategies today: increasing lean protein intake, decreasing simple carbohydrates, increasing vegetables, meal planning and cooking strategies, and planning for success. We discussed various medication options to help [facility] with her weight loss efforts and we both agreed to increase Wegovy  to 1mg   dose.  Return in about 5 weeks (around 04/16/2024).   She was informed of the importance of frequent follow up visits to maximize her success  with intensive lifestyle modifications for her multiple health conditions.  Attestation Statements:   Reviewed by clinician on day of visit: allergies, medications, problem list, medical history, surgical history, family history, social history, and previous encounter notes.     Adelita Cho, MD

## 2024-03-16 NOTE — Assessment & Plan Note (Signed)
 Patient's vitamin D  level is slightly decreased from previously.  She needs a refill of her prescription strength vitamin D  today.  7-month prescription sent in.  No nausea, vomiting, or muscle weakness reported but significant fatigue.  Repeat lab to assess supplementation in January

## 2024-03-16 NOTE — Assessment & Plan Note (Signed)
 Most recent fasting lipid panel showing an increase in LDL with a slight decrease in HDL qualifying her for dyslipidemia.  She is working on dietary and lifestyle changes to help decrease and control LDL.  Will continue these changes and will need follow-up labs in January 2026.  No medication is indicated at this time.

## 2024-04-21 ENCOUNTER — Ambulatory Visit (INDEPENDENT_AMBULATORY_CARE_PROVIDER_SITE_OTHER): Payer: Self-pay | Admitting: Family Medicine

## 2024-04-30 ENCOUNTER — Ambulatory Visit (INDEPENDENT_AMBULATORY_CARE_PROVIDER_SITE_OTHER): Admitting: Family Medicine

## 2024-05-12 ENCOUNTER — Ambulatory Visit (INDEPENDENT_AMBULATORY_CARE_PROVIDER_SITE_OTHER): Admitting: Family Medicine

## 2024-05-12 VITALS — BP 141/88 | HR 68 | Temp 97.9°F | Ht 68.0 in | Wt 337.0 lb

## 2024-05-12 DIAGNOSIS — Z6841 Body Mass Index (BMI) 40.0 and over, adult: Secondary | ICD-10-CM | POA: Diagnosis not present

## 2024-05-12 DIAGNOSIS — I10 Essential (primary) hypertension: Secondary | ICD-10-CM

## 2024-05-12 DIAGNOSIS — R7303 Prediabetes: Secondary | ICD-10-CM | POA: Diagnosis not present

## 2024-05-12 DIAGNOSIS — E559 Vitamin D deficiency, unspecified: Secondary | ICD-10-CM

## 2024-05-12 MED ORDER — LOSARTAN POTASSIUM-HCTZ 100-25 MG PO TABS: 1.0000 | ORAL_TABLET | Freq: Every day | ORAL | 0 refills | Status: AC

## 2024-05-12 MED ORDER — WEGOVY 1.7 MG/0.75ML ~~LOC~~ SOAJ: 1.7000 mg | SUBCUTANEOUS | 0 refills | Status: DC

## 2024-05-12 MED ORDER — WEGOVY 1 MG/0.5ML ~~LOC~~ SOAJ: 1.0000 mg | SUBCUTANEOUS | 0 refills | Status: DC

## 2024-05-12 MED ORDER — VITAMIN D (ERGOCALCIFEROL) 1.25 MG (50000 UNIT) PO CAPS: 50000.0000 [IU] | ORAL_CAPSULE | ORAL | 0 refills | Status: DC

## 2024-05-12 MED ORDER — METFORMIN HCL ER 500 MG PO TB24: 500.0000 mg | ORAL_TABLET | Freq: Every day | ORAL | 0 refills | Status: DC

## 2024-05-12 NOTE — Progress Notes (Signed)
 SUBJECTIVE:  Chief Complaint: Obesity  Interim History: patient has been staying consistent with her intake since September.  She is trying to start getting ready to decorated for Christmas.  For Thanksgiving she is going to her in-laws tomorrow.  Saturday she is doing Thanksgiving with her mother's side of the family.  Patient is anticipating quite a few parties and celebrations in December.  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 Having a bit of heartburn with Wegovy  but mostly doing well.   Girl is here to discuss her progress with her obesity treatment plan. She is on the Category 4 Plan and states she is following her eating plan approximately 75 % of the time. She states she is walking 30 minutes 2 times per week.   OBJECTIVE: Visit Diagnoses: Problem List Items Addressed This Visit       Cardiovascular and Mediastinum   Essential hypertension - Primary   Relevant Medications   losartan -hydrochlorothiazide (HYZAAR) 100-25 MG tablet     Other   Vitamin D  deficiency   Relevant Medications   Vitamin D , Ergocalciferol , (DRISDOL ) 1.25 MG (50000 UNIT) CAPS capsule   Obesity with starting BMI of 55.8   Relevant Medications   metFORMIN  (GLUCOPHAGE -XR) 500 MG 24 hr tablet   semaglutide -weight management (WEGOVY ) 1.7 MG/0.75ML SOAJ SQ injection   BMI 50.0-59.9, adult (HCC) Current BMI 52.6   Relevant Medications   metFORMIN  (GLUCOPHAGE -XR) 500 MG 24 hr tablet   semaglutide -weight management (WEGOVY ) 1.7 MG/0.75ML SOAJ SQ injection   Prediabetes   Relevant Medications   metFORMIN  (GLUCOPHAGE -XR) 500 MG 24 hr tablet    Vitals Temp: 97.9 F (36.6 C) BP: (!) 141/88 Pulse Rate: 68 SpO2: 99 %   Anthropometric Measurements Height: 5' 8 (1.727 m) Weight: (!) 337 lb (152.9 kg) BMI (Calculated): 51.25 Weight at Last Visit: 337 lb Weight Lost Since Last Visit: 0 Weight Gained Since Last Visit: 0 Starting Weight: 366 lb Total Weight Loss (lbs): 29 lb (13.2  kg)   Body Composition  Body Fat %: 52.9 % Fat Mass (lbs): 178.4 lbs Muscle Mass (lbs): 151 lbs Total Body Water (lbs): 116 lbs Visceral Fat Rating : 18   Other Clinical Data Today's Visit #: 22 Starting Date: 10/03/22 Comments: Cat 4     ASSESSMENT AND PLAN: Assessment & Plan Essential hypertension Blood pressure very slightly elevated today.  No chest pain, chest pressure, or headache reported.  Needs refill of the losartan  hydrochlorothiazide.  Will send in at same dosage and if blood pressure remains elevated may need to increase dosage at next appointment. Vitamin D  deficiency Slight decrease in vitamin D  level since last February.  Refill of prescription strength vitamin D  sent to pharmacy.  Patient instructed to take this at the same time that she takes her Wegovy  to ensure she does not forget. Prediabetes Tolerating metformin  500 mg daily.  Needs refill of that today.  No GI side effects reported.  Will continue current dosage as patient is on semaglutide  as well. Morbid obesity (HCC)  BMI 50.0-59.9, adult (HCC)    Diet: [place] is currently in the action stage of change. As such, her goal is to continue with weight loss efforts and has agreed to the Category 4 Plan.   Exercise:  For substantial health benefits, adults should do at least 150 minutes (2 hours and 30 minutes) a week of moderate-intensity, or 75 minutes (1 hour and 15 minutes) a week of vigorous-intensity aerobic physical activity, or an equivalent combination  of moderate- and vigorous-intensity aerobic activity. Aerobic activity should be performed in episodes of at least 10 minutes, and preferably, it should be spread throughout the week.  Behavior Modification:  We discussed the following Behavioral Modification Strategies today: increasing lean protein intake, decreasing simple carbohydrates, meal planning and cooking strategies, holiday eating strategies, celebration eating strategies, and planning  for success. We discussed various medication options to help [facility] with her weight loss efforts and we both agreed to increase semaglutide  to 1.7 mg weekly..  Return in about 6 weeks (around 06/23/2024).   She was informed of the importance of frequent follow up visits to maximize her success with intensive lifestyle modifications for her multiple health conditions.  Attestation Statements:   Reviewed by clinician on day of visit: allergies, medications, problem list, medical history, surgical history, family history, social history, and previous encounter notes.     Adelita Cho, MD

## 2024-05-17 NOTE — Assessment & Plan Note (Signed)
 Tolerating metformin  500 mg daily.  Needs refill of that today.  No GI side effects reported.  Will continue current dosage as patient is on semaglutide  as well.

## 2024-05-17 NOTE — Assessment & Plan Note (Signed)
 Blood pressure very slightly elevated today.  No chest pain, chest pressure, or headache reported.  Needs refill of the losartan  hydrochlorothiazide.  Will send in at same dosage and if blood pressure remains elevated may need to increase dosage at next appointment.

## 2024-05-17 NOTE — Assessment & Plan Note (Signed)
 Slight decrease in vitamin D  level since last February.  Refill of prescription strength vitamin D  sent to pharmacy.  Patient instructed to take this at the same time that she takes her Wegovy  to ensure she does not forget.

## 2024-06-04 ENCOUNTER — Other Ambulatory Visit (INDEPENDENT_AMBULATORY_CARE_PROVIDER_SITE_OTHER): Payer: Self-pay | Admitting: Family Medicine

## 2024-06-04 DIAGNOSIS — R7303 Prediabetes: Secondary | ICD-10-CM

## 2024-06-06 ENCOUNTER — Other Ambulatory Visit (INDEPENDENT_AMBULATORY_CARE_PROVIDER_SITE_OTHER): Payer: Self-pay | Admitting: Family Medicine

## 2024-06-06 DIAGNOSIS — R7303 Prediabetes: Secondary | ICD-10-CM

## 2024-06-23 ENCOUNTER — Encounter (INDEPENDENT_AMBULATORY_CARE_PROVIDER_SITE_OTHER): Payer: Self-pay | Admitting: Family Medicine

## 2024-06-23 ENCOUNTER — Ambulatory Visit (INDEPENDENT_AMBULATORY_CARE_PROVIDER_SITE_OTHER): Admitting: Family Medicine

## 2024-06-23 DIAGNOSIS — R7303 Prediabetes: Secondary | ICD-10-CM | POA: Diagnosis not present

## 2024-06-23 DIAGNOSIS — Z6841 Body Mass Index (BMI) 40.0 and over, adult: Secondary | ICD-10-CM | POA: Diagnosis not present

## 2024-06-23 DIAGNOSIS — E559 Vitamin D deficiency, unspecified: Secondary | ICD-10-CM | POA: Diagnosis not present

## 2024-06-23 MED ORDER — VITAMIN D (ERGOCALCIFEROL) 1.25 MG (50000 UNIT) PO CAPS: 50000.0000 [IU] | ORAL_CAPSULE | ORAL | 0 refills | Status: AC

## 2024-06-23 MED ORDER — METFORMIN HCL ER 500 MG PO TB24: 500.0000 mg | ORAL_TABLET | Freq: Every day | ORAL | 0 refills | Status: AC

## 2024-06-23 MED ORDER — WEGOVY 1.7 MG/0.75ML ~~LOC~~ SOAJ: 1.7000 mg | SUBCUTANEOUS | 0 refills | Status: AC

## 2024-06-23 NOTE — Progress Notes (Signed)
 "  SUBJECTIVE:  Chief Complaint: Obesity  Interim History: Patient had good holidays but had quite a bit of traveling around.  She did get to spend Christmas Day at home. She missed the month of December for Wegovy  due to just getting so busy she didn't pick up the Rx.  Patient is very excited for the new year and has a plan set up for fitness and diet.  She is planning to walk two nights a week and do her weighted hula hoop two nights a week.  Her goal is to be organized and plan food and life wise for the next few weeks. She wants to get back into weighing food more consistently to ensure adequate intake.   Tabitha Golden is here to discuss her progress with her obesity treatment plan. She is on the Category 4 Plan and states she is following her eating plan approximately 40-50 % of the time. She states she is exercising consistently.    OBJECTIVE: Visit Diagnoses: Problem List Items Addressed This Visit       Other   Vitamin D  deficiency   Relevant Medications   Vitamin D , Ergocalciferol , (DRISDOL ) 1.25 MG (50000 UNIT) CAPS capsule   Obesity with starting BMI of 55.8 - Primary   Relevant Medications   semaglutide -weight management (WEGOVY ) 1.7 MG/0.75ML SOAJ SQ injection   metFORMIN  (GLUCOPHAGE -XR) 500 MG 24 hr tablet   BMI 50.0-59.9, adult (HCC) Current BMI 52.6   Relevant Medications   semaglutide -weight management (WEGOVY ) 1.7 MG/0.75ML SOAJ SQ injection   metFORMIN  (GLUCOPHAGE -XR) 500 MG 24 hr tablet   Prediabetes   Relevant Medications   metFORMIN  (GLUCOPHAGE -XR) 500 MG 24 hr tablet    Vitals Temp: 98.2 F (36.8 C) BP: (!) 141/88 Pulse Rate: 81 SpO2: 99 %   Anthropometric Measurements Height: 5' 8 (1.727 m) Weight: (!) 340 lb (154.2 kg) BMI (Calculated): 51.71 Weight at Last Visit: 337 lb Weight Lost Since Last Visit: 0 Weight Gained Since Last Visit: 3 Starting Weight: 366 lb Total Weight Loss (lbs): 26 lb (11.8 kg)   Body Composition  Body Fat %: 53.5 % Fat  Mass (lbs): 182.2 lbs Muscle Mass (lbs): 150.4 lbs Total Body Water (lbs): 117.6 lbs Visceral Fat Rating : 19   Other Clinical Data Today's Visit #: 23 Starting Date: 10/03/22 Comments: Cat 4     ASSESSMENT AND PLAN: Assessment & Plan Vitamin D  deficiency Tolerating prescription strength Vitamin D  and needs refill today.  No side effects mentioned. Prediabetes Tolerating metformin  and GLP1 usage for management of blood sugars.  Refill metformin  at current dose with no change.   BMI 50.0-59.9, adult (HCC)  Morbid obesity (HCC)    Diet: Tabitha Golden is currently in the action stage of change. As such, her goal is to continue with weight loss efforts and has agreed to the Category 4 Plan.   Exercise:  For substantial health benefits, adults should do at least 150 minutes (2 hours and 30 minutes) a week of moderate-intensity, or 75 minutes (1 hour and 15 minutes) a week of vigorous-intensity aerobic physical activity, or an equivalent combination of moderate- and vigorous-intensity aerobic activity. Aerobic activity should be performed in episodes of at least 10 minutes, and preferably, it should be spread throughout the week.  Behavior Modification:  We discussed the following Behavioral Modification Strategies today: increasing lean protein intake, decreasing simple carbohydrates, increasing vegetables, meal planning and cooking strategies, keeping healthy foods in the home, and planning for success. We discussed various medication options to help  Tabitha Golden with her weight loss efforts and we both agreed to continue wegovy  at current dose.  Return in about 4 weeks (around 07/21/2024) for fasting labs.   She was informed of the importance of frequent follow up visits to maximize her success with intensive lifestyle modifications for her multiple health conditions.  Attestation Statements:   Reviewed by clinician on day of visit: allergies, medications, problem list, medical history, surgical  history, family history, social history, and previous encounter notes.   Tabitha Cho, MD "

## 2024-06-26 NOTE — Assessment & Plan Note (Signed)
 Tolerating prescription strength Vitamin D  and needs refill today.  No side effects mentioned.

## 2024-06-26 NOTE — Assessment & Plan Note (Signed)
 Tolerating metformin  and GLP1 usage for management of blood sugars.  Refill metformin  at current dose with no change.

## 2024-07-28 ENCOUNTER — Ambulatory Visit (INDEPENDENT_AMBULATORY_CARE_PROVIDER_SITE_OTHER): Admitting: Family Medicine
